# Patient Record
Sex: Male | Born: 1980 | Race: White | Hispanic: No | Marital: Married | Smoking: Former smoker
Health system: Southern US, Community
[De-identification: ages and names within clinical notes are randomized; demographics above are authoritative.]

## PROBLEM LIST (undated history)

## (undated) DIAGNOSIS — F419 Anxiety disorder, unspecified: Secondary | ICD-10-CM

## (undated) DIAGNOSIS — K760 Fatty (change of) liver, not elsewhere classified: Secondary | ICD-10-CM

## (undated) DIAGNOSIS — I10 Essential (primary) hypertension: Secondary | ICD-10-CM

## (undated) DIAGNOSIS — G4730 Sleep apnea, unspecified: Secondary | ICD-10-CM

## (undated) HISTORY — PX: WISDOM TOOTH EXTRACTION: SHX21

---
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
## 2001-04-28 ENCOUNTER — Encounter: Admission: RE | Admit: 2001-04-28 | Discharge: 2001-07-27 | Payer: Self-pay | Admitting: *Deleted

## 2001-07-07 ENCOUNTER — Emergency Department (HOSPITAL_COMMUNITY): Admission: EM | Admit: 2001-07-07 | Discharge: 2001-07-08 | Payer: Self-pay | Admitting: Emergency Medicine

## 2007-11-01 ENCOUNTER — Ambulatory Visit (HOSPITAL_COMMUNITY): Admission: RE | Admit: 2007-11-01 | Discharge: 2007-11-01 | Payer: Self-pay | Admitting: Sports Medicine

## 2010-10-19 ENCOUNTER — Encounter: Payer: Self-pay | Admitting: Sports Medicine

## 2017-05-25 ENCOUNTER — Other Ambulatory Visit: Payer: Self-pay | Admitting: Neurosurgery

## 2017-06-07 ENCOUNTER — Encounter (HOSPITAL_COMMUNITY): Payer: Self-pay

## 2017-06-07 ENCOUNTER — Other Ambulatory Visit (HOSPITAL_COMMUNITY): Payer: Self-pay | Admitting: *Deleted

## 2017-06-07 NOTE — Pre-Procedure Instructions (Signed)
Rosalie GumsRyan J Dekay  06/07/2017      [facility] [id number] - [place], [place] - [address] [place] [id number] Phone: [phone] Fax: [phone]    Your procedure is scheduled on 06-10-2017  Wednesday .  [facility] Admitting at  9:30 A.M.   Call this number if you have problems the morning of surgery:  [phone]   Remember:  Do not eat food or drink liquids after midnight.   Take these medicines the morning of surgery with A SIP OF WATER Alprazolam(Xanax),pain medication if needed,  STOP ASPIRIN,ANTIINFLAMATORIES (IBUPROFEN,ALEVE,MOTRIN,ADVIL,GOODY'S POWDERS),HERBAL SUPPLEMENTS,FISH OIL,AND VITAMINS 5-7 DAYS PRIOR TO SURGERY   Do not wear jewelry,   Do not wear lotions, powders, or perfumes, or deoderant.  Do not shave 48 hours prior to surgery.  Men may shave face and neck.   Do not bring valuables to the hospital.  [facility] is not responsible for any belongings or valuables.  Contacts, dentures or bridgework may not be worn into surgery.  Leave your suitcase in the car.  After surgery it may be brought to your room.  For patients admitted to the hospital, discharge time will be determined by your treatment team.  Patients discharged the day of surgery will not be allowed to drive home.    Special Instructions:  - Preparing for Surgery  Before surgery, you can play an important role.  Because skin is not sterile, your skin needs to be as free of germs as possible.  You can reduce the number of germs on you skin by washing with CHG (chlorahexidine gluconate) soap before surgery.  CHG is an antiseptic cleaner which kills germs and bonds with the skin to continue killing germs even after washing.  Please DO NOT use if you have an allergy to CHG or antibacterial soaps.  If your skin becomes reddened/irritated stop using the CHG and inform your nurse when you arrive at  Short Stay.  Do not shave (including legs and underarms) for at least 48 hours prior to the first CHG shower.  You may shave your face.  Please follow these instructions carefully:   1.  Shower with CHG Soap the night before surgery and the   morning of Surgery.  2.  If you choose to wash your hair, wash your hair first as usual with your normal shampoo.  3.  After you shampoo, rinse your hair and body thoroughly to remove the  Shampoo.  4.  Use CHG as you would any other liquid soap.  You can apply chg directly  to the skin and wash gently with scrungie or a clean washcloth.  5.  Apply the CHG Soap to your body ONLY FROM THE NECK DOWN.   Do not use on open wounds or open sores.  Avoid contact with your eyes,  ears, mouth and genitals (private parts).  Wash genitals (private parts) with your normal soap.  6.  Wash thoroughly, paying special attention to the area where your surgery will be performed.  7.  Thoroughly rinse your body with warm water from the neck down.  8.  DO NOT shower/wash with your normal soap after using and rinsing o  the CHG Soap.  9.  Pat yourself dry with a clean towel.            10.  Wear clean pajamas.  11.  Place clean sheets on your bed the night of your first shower and do not sleep with pets.  Day of Surgery  Do not apply any lotions/deodorants the morning of surgery.  Please wear clean clothes to the hospital/surgery center.   Please read over the following fact sheets that you were given. MRSA Information and Surgical Site Infection Prevention

## 2017-06-08 ENCOUNTER — Encounter (HOSPITAL_COMMUNITY)
Admission: RE | Admit: 2017-06-08 | Discharge: 2017-06-08 | Disposition: A | Discharge status: Home / Self Care | Payer: Managed Care, Other (non HMO) | Source: Ambulatory Visit | Attending: Neurosurgery | Admitting: Neurosurgery

## 2017-06-08 ENCOUNTER — Encounter (HOSPITAL_COMMUNITY): Payer: Self-pay

## 2017-06-08 HISTORY — DX: Fatty (change of) liver, not elsewhere classified: K76.0

## 2017-06-08 HISTORY — DX: Anxiety disorder, unspecified: F41.9

## 2017-06-08 HISTORY — DX: Essential (primary) hypertension: I10

## 2017-06-08 HISTORY — DX: Sleep apnea, unspecified: G47.30

## 2017-06-08 LAB — CBC
HCT: 46.5 % (ref 39.0–52.0)
Hemoglobin: 15.8 g/dL (ref 13.0–17.0)
MCH: 31.8 pg (ref 26.0–34.0)
MCHC: 34 g/dL (ref 30.0–36.0)
MCV: 93.6 fL (ref 78.0–100.0)
Platelets: 157 10*3/uL (ref 150–400)
RBC: 4.97 MIL/uL (ref 4.22–5.81)
RDW: 12.9 % (ref 11.5–15.5)
WBC: 9.4 10*3/uL (ref 4.0–10.5)

## 2017-06-08 LAB — TYPE AND SCREEN
ABO/RH(D): O POS
Antibody Screen: NEGATIVE

## 2017-06-08 LAB — COMPREHENSIVE METABOLIC PANEL
ALT: 67 U/L — ABNORMAL HIGH (ref 17–63)
AST: 48 U/L — ABNORMAL HIGH (ref 15–41)
Albumin: 4.2 g/dL (ref 3.5–5.0)
Alkaline Phosphatase: 82 U/L (ref 38–126)
Anion gap: 9 (ref 5–15)
BUN: 12 mg/dL (ref 6–20)
CO2: 24 mmol/L (ref 22–32)
Calcium: 9.6 mg/dL (ref 8.9–10.3)
Chloride: 103 mmol/L (ref 101–111)
Creatinine, Ser: 1.04 mg/dL (ref 0.61–1.24)
GFR calc Af Amer: >60 mL/min (ref >60)
GFR calc non Af Amer: >60 mL/min (ref >60)
Glucose, Bld: 86 mg/dL (ref 65–99)
Potassium: 3.6 mmol/L (ref 3.5–5.1)
Sodium: 136 mmol/L (ref 135–145)
Total Bilirubin: 0.8 mg/dL (ref 0.3–1.2)
Total Protein: 7.2 g/dL (ref 6.5–8.1)

## 2017-06-08 LAB — SURGICAL PCR SCREEN
MRSA, PCR: NEGATIVE
Staphylococcus aureus: NEGATIVE

## 2017-06-08 LAB — ABO/RH: ABO/RH(D): O POS

## 2017-06-08 NOTE — Progress Notes (Signed)
Never seen by cardiologist  Denies any cardiac testing,Stress test, ECHO  No complaints of chest pain or discomfort

## 2017-06-10 ENCOUNTER — Inpatient Hospital Stay (HOSPITAL_COMMUNITY): Payer: Managed Care, Other (non HMO) | Admitting: Anesthesiology

## 2017-06-10 ENCOUNTER — Encounter (HOSPITAL_COMMUNITY)
Admission: RE | Disposition: A | Discharge status: Home / Self Care | Payer: Self-pay | Source: Ambulatory Visit | Attending: Neurosurgery

## 2017-06-10 ENCOUNTER — Inpatient Hospital Stay (HOSPITAL_COMMUNITY)
Admission: RE | Admit: 2017-06-10 | Discharge: 2017-06-11 | DRG: 455 | Disposition: A | Discharge status: Home / Self Care | Payer: Managed Care, Other (non HMO) | Source: Ambulatory Visit | Attending: Neurosurgery | Admitting: Neurosurgery

## 2017-06-10 ENCOUNTER — Encounter (HOSPITAL_COMMUNITY): Payer: Self-pay | Admitting: General Practice

## 2017-06-10 ENCOUNTER — Inpatient Hospital Stay (HOSPITAL_COMMUNITY): Payer: Managed Care, Other (non HMO)

## 2017-06-10 DIAGNOSIS — M48061 Spinal stenosis, lumbar region without neurogenic claudication: Secondary | ICD-10-CM | POA: Diagnosis present

## 2017-06-10 DIAGNOSIS — Z87891 Personal history of nicotine dependence: Secondary | ICD-10-CM

## 2017-06-10 DIAGNOSIS — G4730 Sleep apnea, unspecified: Secondary | ICD-10-CM | POA: Diagnosis present

## 2017-06-10 DIAGNOSIS — Z6837 Body mass index (BMI) 37.0-37.9, adult: Secondary | ICD-10-CM | POA: Diagnosis not present

## 2017-06-10 DIAGNOSIS — Z9989 Dependence on other enabling machines and devices: Secondary | ICD-10-CM

## 2017-06-10 DIAGNOSIS — K219 Gastro-esophageal reflux disease without esophagitis: Secondary | ICD-10-CM | POA: Diagnosis present

## 2017-06-10 DIAGNOSIS — M4316 Spondylolisthesis, lumbar region: Secondary | ICD-10-CM | POA: Diagnosis present

## 2017-06-10 DIAGNOSIS — I10 Essential (primary) hypertension: Secondary | ICD-10-CM | POA: Diagnosis present

## 2017-06-10 DIAGNOSIS — M5416 Radiculopathy, lumbar region: Secondary | ICD-10-CM | POA: Diagnosis present

## 2017-06-10 DIAGNOSIS — Z419 Encounter for procedure for purposes other than remedying health state, unspecified: Secondary | ICD-10-CM

## 2017-06-10 SURGERY — POSTERIOR LUMBAR FUSION 1 LEVEL
Anesthesia: General | Site: Back

## 2017-06-10 MED ORDER — VANCOMYCIN HCL 1000 MG IV SOLR
INTRAVENOUS | Status: DC | PRN
  Administered 2017-06-10: 1000 mg via TOPICAL

## 2017-06-10 MED ORDER — ZOLPIDEM TARTRATE 5 MG PO TABS: 10.0000 mg | ORAL_TABLET | Freq: Every evening | ORAL | Status: DC | PRN

## 2017-06-10 MED ORDER — PHENYLEPHRINE 40 MCG/ML (10ML) SYRINGE FOR IV PUSH (FOR BLOOD PRESSURE SUPPORT)
PREFILLED_SYRINGE | INTRAVENOUS | Status: AC
  Filled 2017-06-10: qty 10

## 2017-06-10 MED ORDER — ONDANSETRON HCL 4 MG/2ML IJ SOLN: 4.0000 mg | Freq: Four times a day (QID) | INTRAMUSCULAR | Status: DC | PRN

## 2017-06-10 MED ORDER — ROCURONIUM BROMIDE 100 MG/10ML IV SOLN
INTRAVENOUS | Status: DC | PRN
Start: 2017-06-10 — End: 2017-06-10
  Administered 2017-06-10: 20 mg via INTRAVENOUS
  Administered 2017-06-10: 10 mg via INTRAVENOUS
  Administered 2017-06-10: 60 mg via INTRAVENOUS
  Administered 2017-06-10 (×2): 20 mg via INTRAVENOUS
  Administered 2017-06-10: 10 mg via INTRAVENOUS

## 2017-06-10 MED ORDER — ONDANSETRON HCL 4 MG/2ML IJ SOLN
INTRAMUSCULAR | Status: AC
  Filled 2017-06-10: qty 2

## 2017-06-10 MED ORDER — SUGAMMADEX SODIUM 200 MG/2ML IV SOLN
INTRAVENOUS | Status: AC
  Filled 2017-06-10: qty 2

## 2017-06-10 MED ORDER — HYDROMORPHONE HCL 1 MG/ML IJ SOLN
0.2500 mg | INTRAMUSCULAR | Status: DC | PRN
  Administered 2017-06-10 (×2): 0.5 mg via INTRAVENOUS

## 2017-06-10 MED ORDER — HYDROMORPHONE HCL 1 MG/ML IJ SOLN
0.5000 mg | INTRAMUSCULAR | Status: DC | PRN
  Administered 2017-06-10: 0.5 mg via INTRAVENOUS
  Filled 2017-06-10: qty 0.5

## 2017-06-10 MED ORDER — VANCOMYCIN HCL 1000 MG IV SOLR
INTRAVENOUS | Status: AC
  Filled 2017-06-10: qty 1000

## 2017-06-10 MED ORDER — THROMBIN 20000 UNITS EX SOLR
CUTANEOUS | Status: AC
  Filled 2017-06-10: qty 20000

## 2017-06-10 MED ORDER — MIDAZOLAM HCL 2 MG/2ML IJ SOLN
INTRAMUSCULAR | Status: AC
  Filled 2017-06-10: qty 2

## 2017-06-10 MED ORDER — SUGAMMADEX SODIUM 200 MG/2ML IV SOLN
INTRAVENOUS | Status: DC | PRN
  Administered 2017-06-10: 200 mg via INTRAVENOUS

## 2017-06-10 MED ORDER — LIDOCAINE HCL (CARDIAC) 20 MG/ML IV SOLN
INTRAVENOUS | Status: DC | PRN
  Administered 2017-06-10: 20 mg via INTRAVENOUS

## 2017-06-10 MED ORDER — BUPIVACAINE HCL (PF) 0.5 % IJ SOLN
INTRAMUSCULAR | Status: AC
  Filled 2017-06-10: qty 30

## 2017-06-10 MED ORDER — FENTANYL CITRATE (PF) 250 MCG/5ML IJ SOLN
INTRAMUSCULAR | Status: AC
  Filled 2017-06-10: qty 5

## 2017-06-10 MED ORDER — MIDAZOLAM HCL 5 MG/5ML IJ SOLN
INTRAMUSCULAR | Status: DC | PRN
  Administered 2017-06-10: 2 mg via INTRAVENOUS

## 2017-06-10 MED ORDER — OXYCODONE-ACETAMINOPHEN 5-325 MG PO TABS
1.0000 | ORAL_TABLET | ORAL | Status: DC | PRN
  Administered 2017-06-10 – 2017-06-11 (×4): 2 via ORAL
  Filled 2017-06-10 (×4): qty 2

## 2017-06-10 MED ORDER — THROMBIN 20000 UNITS EX SOLR
CUTANEOUS | Status: DC | PRN
  Administered 2017-06-10: 13:00:00 via TOPICAL

## 2017-06-10 MED ORDER — CHLORHEXIDINE GLUCONATE CLOTH 2 % EX PADS: 6.0000 | MEDICATED_PAD | Freq: Once | CUTANEOUS | Status: DC

## 2017-06-10 MED ORDER — ONDANSETRON HCL 4 MG PO TABS: 4.0000 mg | ORAL_TABLET | Freq: Four times a day (QID) | ORAL | Status: DC | PRN

## 2017-06-10 MED ORDER — DEXAMETHASONE SODIUM PHOSPHATE 10 MG/ML IJ SOLN
INTRAMUSCULAR | Status: AC
  Filled 2017-06-10: qty 1

## 2017-06-10 MED ORDER — LIDOCAINE-EPINEPHRINE 1 %-1:100000 IJ SOLN
INTRAMUSCULAR | Status: DC | PRN
  Administered 2017-06-10: 10 mL

## 2017-06-10 MED ORDER — ONDANSETRON HCL 4 MG/2ML IJ SOLN
INTRAMUSCULAR | Status: DC | PRN
  Administered 2017-06-10: 4 mg via INTRAVENOUS

## 2017-06-10 MED ORDER — PANTOPRAZOLE SODIUM 40 MG PO TBEC
40.0000 mg | DELAYED_RELEASE_TABLET | Freq: Every day | ORAL | Status: DC
  Administered 2017-06-10: 40 mg via ORAL
  Filled 2017-06-10: qty 1

## 2017-06-10 MED ORDER — CEFAZOLIN SODIUM-DEXTROSE 2-4 GM/100ML-% IV SOLN
2.0000 g | Freq: Three times a day (TID) | INTRAVENOUS | Status: DC
  Administered 2017-06-10 – 2017-06-11 (×2): 2 g via INTRAVENOUS
  Filled 2017-06-10 (×2): qty 100

## 2017-06-10 MED ORDER — MEPERIDINE HCL 25 MG/ML IJ SOLN: 6.2500 mg | INTRAMUSCULAR | Status: DC | PRN

## 2017-06-10 MED ORDER — ROCURONIUM BROMIDE 10 MG/ML (PF) SYRINGE
PREFILLED_SYRINGE | INTRAVENOUS | Status: AC
  Filled 2017-06-10: qty 5

## 2017-06-10 MED ORDER — FENTANYL CITRATE (PF) 100 MCG/2ML IJ SOLN
INTRAMUSCULAR | Status: DC | PRN
  Administered 2017-06-10 (×2): 50 ug via INTRAVENOUS
  Administered 2017-06-10: 200 ug via INTRAVENOUS
  Administered 2017-06-10: 50 ug via INTRAVENOUS
  Administered 2017-06-10: 100 ug via INTRAVENOUS
  Administered 2017-06-10: 50 ug via INTRAVENOUS

## 2017-06-10 MED ORDER — PHENOL 1.4 % MT LIQD: 1.0000 | OROMUCOSAL | Status: DC | PRN

## 2017-06-10 MED ORDER — AMLODIPINE BESYLATE 10 MG PO TABS
10.0000 mg | ORAL_TABLET | Freq: Every day | ORAL | Status: DC
  Administered 2017-06-11: 10 mg via ORAL
  Filled 2017-06-10 (×2): qty 1

## 2017-06-10 MED ORDER — CALCIUM CARBONATE ANTACID 500 MG PO CHEW
2.0000 | CHEWABLE_TABLET | Freq: Three times a day (TID) | ORAL | Status: DC | PRN
  Filled 2017-06-10: qty 2

## 2017-06-10 MED ORDER — OXYCODONE-ACETAMINOPHEN 10-325 MG PO TABS: 1.0000 | ORAL_TABLET | Freq: Four times a day (QID) | ORAL | Status: DC | PRN

## 2017-06-10 MED ORDER — DEXAMETHASONE SODIUM PHOSPHATE 10 MG/ML IJ SOLN
10.0000 mg | INTRAMUSCULAR | Status: AC
  Administered 2017-06-10: 10 mg via INTRAVENOUS

## 2017-06-10 MED ORDER — OXYCODONE HCL 5 MG PO TABS
5.0000 mg | ORAL_TABLET | ORAL | Status: DC | PRN
  Administered 2017-06-10: 10 mg via ORAL

## 2017-06-10 MED ORDER — HYDROCHLOROTHIAZIDE 25 MG PO TABS
25.0000 mg | ORAL_TABLET | Freq: Every day | ORAL | Status: DC
  Administered 2017-06-11: 25 mg via ORAL
  Filled 2017-06-10: qty 1

## 2017-06-10 MED ORDER — MENTHOL 3 MG MT LOZG: 1.0000 | LOZENGE | OROMUCOSAL | Status: DC | PRN

## 2017-06-10 MED ORDER — ACETAMINOPHEN 325 MG PO TABS: 650.0000 mg | ORAL_TABLET | ORAL | Status: DC | PRN

## 2017-06-10 MED ORDER — BUPIVACAINE LIPOSOME 1.3 % IJ SUSP
20.0000 mL | INTRAMUSCULAR | Status: DC
  Filled 2017-06-10: qty 20

## 2017-06-10 MED ORDER — LIDOCAINE-EPINEPHRINE 1 %-1:100000 IJ SOLN
INTRAMUSCULAR | Status: AC
  Filled 2017-06-10: qty 1

## 2017-06-10 MED ORDER — MIDAZOLAM HCL 2 MG/2ML IJ SOLN: 0.5000 mg | Freq: Once | INTRAMUSCULAR | Status: DC | PRN

## 2017-06-10 MED ORDER — LIDOCAINE 2% (20 MG/ML) 5 ML SYRINGE
INTRAMUSCULAR | Status: AC
  Filled 2017-06-10: qty 10

## 2017-06-10 MED ORDER — CYCLOBENZAPRINE HCL 10 MG PO TABS
10.0000 mg | ORAL_TABLET | Freq: Three times a day (TID) | ORAL | Status: DC | PRN
  Administered 2017-06-10 – 2017-06-11 (×3): 10 mg via ORAL
  Filled 2017-06-10 (×2): qty 1

## 2017-06-10 MED ORDER — OXYCODONE HCL 5 MG PO TABS
ORAL_TABLET | ORAL | Status: AC
Start: 2017-06-10 — End: 2017-06-11
  Filled 2017-06-10: qty 2

## 2017-06-10 MED ORDER — PHENYLEPHRINE HCL 10 MG/ML IJ SOLN
INTRAMUSCULAR | Status: DC | PRN
  Administered 2017-06-10: 20 ug/min via INTRAVENOUS

## 2017-06-10 MED ORDER — EPHEDRINE 5 MG/ML INJ
INTRAVENOUS | Status: AC
  Filled 2017-06-10: qty 10

## 2017-06-10 MED ORDER — SODIUM CHLORIDE 0.9% FLUSH: 3.0000 mL | INTRAVENOUS | Status: DC | PRN

## 2017-06-10 MED ORDER — PROPOFOL 10 MG/ML IV BOLUS
INTRAVENOUS | Status: AC
  Filled 2017-06-10: qty 20

## 2017-06-10 MED ORDER — PHENYLEPHRINE HCL 10 MG/ML IJ SOLN
INTRAMUSCULAR | Status: DC | PRN
  Administered 2017-06-10: 80 ug via INTRAVENOUS
  Administered 2017-06-10: 40 ug via INTRAVENOUS

## 2017-06-10 MED ORDER — PANTOPRAZOLE SODIUM 40 MG IV SOLR: 40.0000 mg | Freq: Every day | INTRAVENOUS | Status: DC

## 2017-06-10 MED ORDER — PROMETHAZINE HCL 25 MG/ML IJ SOLN: 6.2500 mg | INTRAMUSCULAR | Status: DC | PRN

## 2017-06-10 MED ORDER — CEFAZOLIN SODIUM-DEXTROSE 2-4 GM/100ML-% IV SOLN
2.0000 g | INTRAVENOUS | Status: AC
  Administered 2017-06-10: 2 g via INTRAVENOUS

## 2017-06-10 MED ORDER — CYCLOBENZAPRINE HCL 10 MG PO TABS
ORAL_TABLET | ORAL | Status: AC
  Filled 2017-06-10: qty 1

## 2017-06-10 MED ORDER — THROMBIN 5000 UNITS EX SOLR
CUTANEOUS | Status: AC
  Filled 2017-06-10: qty 5000

## 2017-06-10 MED ORDER — ALPRAZOLAM 0.25 MG PO TABS: 0.2500 mg | ORAL_TABLET | Freq: Two times a day (BID) | ORAL | Status: DC | PRN

## 2017-06-10 MED ORDER — ALUM & MAG HYDROXIDE-SIMETH 200-200-20 MG/5ML PO SUSP: 30.0000 mL | Freq: Four times a day (QID) | ORAL | Status: DC | PRN

## 2017-06-10 MED ORDER — BUPIVACAINE LIPOSOME 1.3 % IJ SUSP
INTRAMUSCULAR | Status: DC | PRN
Start: 2017-06-10 — End: 2017-06-10
  Administered 2017-06-10: 20 mL

## 2017-06-10 MED ORDER — ACETAMINOPHEN 650 MG RE SUPP: 650.0000 mg | RECTAL | Status: DC | PRN

## 2017-06-10 MED ORDER — 0.9 % SODIUM CHLORIDE (POUR BTL) OPTIME
TOPICAL | Status: DC | PRN
  Administered 2017-06-10: 1000 mL

## 2017-06-10 MED ORDER — SODIUM CHLORIDE 0.9% FLUSH
3.0000 mL | Freq: Two times a day (BID) | INTRAVENOUS | Status: DC
  Administered 2017-06-10: 3 mL via INTRAVENOUS

## 2017-06-10 MED ORDER — LACTATED RINGERS IV SOLN
INTRAVENOUS | Status: DC
  Administered 2017-06-10 (×4): via INTRAVENOUS

## 2017-06-10 MED ORDER — BACITRACIN 50000 UNITS IM SOLR
INTRAMUSCULAR | Status: DC | PRN
  Administered 2017-06-10: 13:00:00

## 2017-06-10 MED ORDER — BISACODYL 5 MG PO TBEC: 5.0000 mg | DELAYED_RELEASE_TABLET | Freq: Every day | ORAL | Status: DC | PRN

## 2017-06-10 MED ORDER — PROPOFOL 10 MG/ML IV BOLUS
INTRAVENOUS | Status: DC | PRN
  Administered 2017-06-10: 100 mg via INTRAVENOUS

## 2017-06-10 MED ORDER — HYDROMORPHONE HCL 1 MG/ML IJ SOLN
INTRAMUSCULAR | Status: AC
  Administered 2017-06-10: 0.5 mg via INTRAVENOUS
  Filled 2017-06-10: qty 1

## 2017-06-10 MED ORDER — CEFAZOLIN SODIUM-DEXTROSE 2-4 GM/100ML-% IV SOLN
INTRAVENOUS | Status: AC
  Filled 2017-06-10: qty 100

## 2017-06-10 MED ORDER — SERTRALINE HCL 50 MG PO TABS
100.0000 mg | ORAL_TABLET | Freq: Every day | ORAL | Status: DC
  Administered 2017-06-10: 100 mg via ORAL
  Filled 2017-06-10: qty 2

## 2017-06-10 SURGICAL SUPPLY — 80 items
ADH SKN CLS APL DERMABOND .7 (GAUZE/BANDAGES/DRESSINGS) ×1
APL SKNCLS STERI-STRIP NONHPOA (GAUZE/BANDAGES/DRESSINGS) ×1
BAG DECANTER FOR FLEXI CONT (MISCELLANEOUS) ×2 IMPLANT
BENZOIN TINCTURE PRP APPL 2/3 (GAUZE/BANDAGES/DRESSINGS) ×2 IMPLANT
BLADE CLIPPER SURG (BLADE) IMPLANT
BLADE SURG 11 STRL SS (BLADE) ×2 IMPLANT
BONE VIVIGEN FORMABLE 5.4CC (Bone Implant) ×2 IMPLANT
BUR CUTTER 7.0 ROUND (BURR) ×2 IMPLANT
BUR MATCHSTICK NEURO 3.0 LAGG (BURR) ×2 IMPLANT
CAGE RISE 11-17-15 10X26 (Cage) ×2 IMPLANT
CANISTER SUCT 3000ML PPV (MISCELLANEOUS) ×2 IMPLANT
CAP LOCKING THREADED (Cap) ×4 IMPLANT
CARTRIDGE OIL MAESTRO DRILL (MISCELLANEOUS) ×1 IMPLANT
CONT SPEC 4OZ CLIKSEAL STRL BL (MISCELLANEOUS) ×2 IMPLANT
COVER BACK TABLE 60X90IN (DRAPES) ×2 IMPLANT
CROSSLINK SPINAL FUSION (Cage) ×1 IMPLANT
DECANTER SPIKE VIAL GLASS SM (MISCELLANEOUS) ×2 IMPLANT
DERMABOND ADVANCED (GAUZE/BANDAGES/DRESSINGS) ×1
DERMABOND ADVANCED .7 DNX12 (GAUZE/BANDAGES/DRESSINGS) ×1 IMPLANT
DIFFUSER DRILL AIR PNEUMATIC (MISCELLANEOUS) ×2 IMPLANT
DRAPE C-ARM 42X72 X-RAY (DRAPES) ×2 IMPLANT
DRAPE C-ARMOR (DRAPES) ×2 IMPLANT
DRAPE HALF SHEET 40X57 (DRAPES) IMPLANT
DRAPE LAPAROTOMY 100X72X124 (DRAPES) ×2 IMPLANT
DRAPE POUCH INSTRU U-SHP 10X18 (DRAPES) ×2 IMPLANT
DRAPE SURG 17X23 STRL (DRAPES) ×2 IMPLANT
DRSG OPSITE 4X5.5 SM (GAUZE/BANDAGES/DRESSINGS) ×1 IMPLANT
DRSG OPSITE POSTOP 4X6 (GAUZE/BANDAGES/DRESSINGS) ×1 IMPLANT
DURAPREP 26ML APPLICATOR (WOUND CARE) ×2 IMPLANT
ELECT BLADE 4.0 EZ CLEAN MEGAD (MISCELLANEOUS) ×2
ELECT REM PT RETURN 9FT ADLT (ELECTROSURGICAL) ×2
ELECTRODE BLDE 4.0 EZ CLN MEGD (MISCELLANEOUS) IMPLANT
ELECTRODE REM PT RTRN 9FT ADLT (ELECTROSURGICAL) ×1 IMPLANT
EVACUATOR 3/16  PVC DRAIN (DRAIN) ×1
EVACUATOR 3/16 PVC DRAIN (DRAIN) IMPLANT
GAUZE SPONGE 4X4 12PLY STRL (GAUZE/BANDAGES/DRESSINGS) ×2 IMPLANT
GAUZE SPONGE 4X4 16PLY XRAY LF (GAUZE/BANDAGES/DRESSINGS) ×1 IMPLANT
GLOVE BIO SURGEON STRL SZ7 (GLOVE) IMPLANT
GLOVE BIO SURGEON STRL SZ8 (GLOVE) ×4 IMPLANT
GLOVE BIOGEL PI IND STRL 7.0 (GLOVE) IMPLANT
GLOVE BIOGEL PI INDICATOR 7.0 (GLOVE)
GLOVE ECLIPSE 7.5 STRL STRAW (GLOVE) IMPLANT
GLOVE EXAM NITRILE LRG STRL (GLOVE) IMPLANT
GLOVE EXAM NITRILE XL STR (GLOVE) IMPLANT
GLOVE EXAM NITRILE XS STR PU (GLOVE) IMPLANT
GLOVE INDICATOR 8.5 STRL (GLOVE) ×4 IMPLANT
GOWN STRL REUS W/ TWL LRG LVL3 (GOWN DISPOSABLE) IMPLANT
GOWN STRL REUS W/ TWL XL LVL3 (GOWN DISPOSABLE) ×2 IMPLANT
GOWN STRL REUS W/TWL 2XL LVL3 (GOWN DISPOSABLE) IMPLANT
GOWN STRL REUS W/TWL LRG LVL3 (GOWN DISPOSABLE)
GOWN STRL REUS W/TWL XL LVL3 (GOWN DISPOSABLE) ×4
GRAFT BNE MATRIX VG FRMBL MD 5 (Bone Implant) IMPLANT
HEMOSTAT POWDER KIT SURGIFOAM (HEMOSTASIS) IMPLANT
KIT BASIN OR (CUSTOM PROCEDURE TRAY) ×2 IMPLANT
KIT ROOM TURNOVER OR (KITS) ×2 IMPLANT
MILL MEDIUM DISP (BLADE) ×1 IMPLANT
NDL HYPO 21X1.5 SAFETY (NEEDLE) IMPLANT
NDL HYPO 25X1 1.5 SAFETY (NEEDLE) ×1 IMPLANT
NEEDLE HYPO 21X1.5 SAFETY (NEEDLE) ×2 IMPLANT
NEEDLE HYPO 25X1 1.5 SAFETY (NEEDLE) ×2 IMPLANT
NS IRRIG 1000ML POUR BTL (IV SOLUTION) ×3 IMPLANT
OIL CARTRIDGE MAESTRO DRILL (MISCELLANEOUS) ×2
PACK LAMINECTOMY NEURO (CUSTOM PROCEDURE TRAY) ×2 IMPLANT
PAD ARMBOARD 7.5X6 YLW CONV (MISCELLANEOUS) ×6 IMPLANT
PATTIES SURGICAL 1X1 (DISPOSABLE) ×1 IMPLANT
ROD CREO 45MM SPINAL (Rod) ×2 IMPLANT
SCREW CREO 6.5X40 (Screw) ×4 IMPLANT
SCREW PA THRD CREO TULIP 5.5X4 (Head) ×4 IMPLANT
SPONGE LAP 4X18 X RAY DECT (DISPOSABLE) IMPLANT
SPONGE SURGIFOAM ABS GEL 100 (HEMOSTASIS) ×2 IMPLANT
STRIP CLOSURE SKIN 1/2X4 (GAUZE/BANDAGES/DRESSINGS) ×3 IMPLANT
SUT VIC AB 0 CT1 18XCR BRD8 (SUTURE) ×2 IMPLANT
SUT VIC AB 0 CT1 8-18 (SUTURE) ×2
SUT VIC AB 2-0 CT1 18 (SUTURE) ×3 IMPLANT
SUT VIC AB 4-0 PS2 27 (SUTURE) ×2 IMPLANT
SYRINGE 20CC LL (MISCELLANEOUS) ×1 IMPLANT
TOWEL GREEN STERILE (TOWEL DISPOSABLE) ×2 IMPLANT
TOWEL GREEN STERILE FF (TOWEL DISPOSABLE) ×2 IMPLANT
TRAY FOLEY W/METER SILVER 16FR (SET/KITS/TRAYS/PACK) ×2 IMPLANT
WATER STERILE IRR 1000ML POUR (IV SOLUTION) ×2 IMPLANT

## 2017-06-10 NOTE — H&P (Signed)
Albert Skinner is an 36 y.o. male.   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 Pain is Progressively worse back and right greater left leg. Workup has been consistent with severe stenosis listhesis at L4-5 due to his failure conservative treatment imaging findings and progressive conical syndrome or recommended decompression sterilization procedure at L4-5. I extensively went over the risks and benefits of the operation with him as well as perioperative course expectations of outcome and alternatives of surgery and he understood and agreed to proceed forward.   Past Medical History:  Diagnosis Date  . Anxiety   . Fatty liver   . Hypertension   . Sleep apnea    uses CPAP    Past Surgical History:  Procedure Laterality Date  . WISDOM TOOTH EXTRACTION      History reviewed. No pertinent family history. Social History:  reports that he quit smoking about a year ago. His smoking use included Cigarettes. He has a 15.00 pack-year smoking history. He quit smokeless tobacco use about 18 years ago. He reports that he drinks alcohol. He reports that he uses drugs, including Marijuana.  Allergies: No Known Allergies  Medications Prior to Admission  Medication Sig Dispense Refill  . ALPRAZolam (XANAX) 0.25 MG tablet Take 0.25 mg by mouth 2 (two) times daily as needed for anxiety.    Marland Kitchen amLODipine (NORVASC) 10 MG tablet Take 10 mg by mouth daily.    . calcium carbonate (TUMS - DOSED IN MG ELEMENTAL CALCIUM) 500 MG chewable tablet Chew 2 tablets by mouth 3 (three) times daily as needed for indigestion or heartburn.    . hydrochlorothiazide (HYDRODIURIL) 25 MG tablet Take 25 mg by mouth daily.    Marland Kitchen oxyCODONE-acetaminophen (PERCOCET) 10-325 MG tablet Take 1 tablet by mouth every 6 (six) hours as needed for pain.    Marland Kitchen sertraline (ZOLOFT) 100 MG tablet Take 100 mg  by mouth at bedtime.    Marland Kitchen zolpidem (AMBIEN) 10 MG tablet Take 10 mg by mouth at bedtime as needed for sleep.      Results for orders placed or performed during the hospital encounter of 06/08/17 (from the past 48 hour(s))  Surgical pcr screen     Status: None   Collection Time: 06/08/17  1:49 PM  Result Value Ref Range   MRSA, PCR NEGATIVE NEGATIVE   Staphylococcus aureus NEGATIVE NEGATIVE    Comment: (NOTE) The Xpert SA Assay (FDA approved for NASAL specimens in patients 104 years of age and older), is one component of a comprehensive surveillance program. It is not intended to diagnose infection nor to guide or monitor treatment.   CBC     Status: None   Collection Time: 06/08/17  1:50 PM  Result Value Ref Range   WBC 9.4 4.0 - 10.5 K/uL   RBC 4.97 4.22 - 5.81 MIL/uL   Hemoglobin 15.8 13.0 - 17.0 g/dL   HCT 46.5 39.0 - 52.0 %   MCV 93.6 78.0 - 100.0 fL   MCH 31.8 26.0 - 34.0 pg   MCHC 34.0 30.0 - 36.0 g/dL   RDW 12.9 11.5 - 15.5 %   Platelets 157 150 - 400 K/uL  Comprehensive metabolic panel     Status: Abnormal   Collection Time: 06/08/17  1:50 PM  Result Value Ref Range   Sodium 136 135 - 145 mmol/L   Potassium 3.6 3.5 -  5.1 mmol/L   Chloride 103 101 - 111 mmol/L   CO2 24 22 - 32 mmol/L   Glucose, Bld 86 65 - 99 mg/dL   BUN 12 6 - 20 mg/dL   Creatinine, Ser 1.04 0.61 - 1.24 mg/dL   Calcium 9.6 8.9 - 10.3 mg/dL   Total Protein 7.2 6.5 - 8.1 g/dL   Albumin 4.2 3.5 - 5.0 g/dL   AST 48 (H) 15 - 41 U/L   ALT 67 (H) 17 - 63 U/L   Alkaline Phosphatase 82 38 - 126 U/L   Total Bilirubin 0.8 0.3 - 1.2 mg/dL   GFR calc non Af Amer >60 >60 mL/min   GFR calc Af Amer >60 >60 mL/min    Comment: (NOTE) The eGFR has been calculated using the CKD EPI equation. This calculation has not been validated in all clinical situations. eGFR's persistently <60 mL/min signify possible Chronic Kidney Disease.    Anion gap 9 5 - 15  Type and screen All Cardiac and thoracic surgeries, spinal  fusions, myomectomies, craniotomies, colon & liver resections, total joint revisions, same day c-section with placenta previa or accreta.     Status: None   Collection Time: 06/08/17  2:05 PM  Result Value Ref Range   ABO/RH(D) O POS    Antibody Screen NEG    Sample Expiration 06/22/2017    Extend sample reason NO TRANSFUSIONS OR PREGNANCY IN THE PAST 3 MONTHS   ABO/Rh     Status: None   Collection Time: 06/08/17  2:05 PM  Result Value Ref Range   ABO/RH(D) O POS    No results found.  Review of Systems  Musculoskeletal: Positive for back pain and myalgias.  Neurological: Positive for tingling and sensory change.    Blood pressure 129/84, pulse 87, temperature 98 F (36.7 C), temperature source Oral, resp. rate 19, height '5\' 11"'  (1.803 m), weight 123.4 kg (272 lb 1 oz), SpO2 96 %. Physical Exam  Constitutional: He is oriented to person, place, and time. He appears well-developed and well-nourished.  HENT:  Head: Normocephalic.  Eyes: Pupils are equal, round, and reactive to light.  Neck: Normal range of motion.  Respiratory: Effort normal.  GI: Soft. Bowel sounds are normal.  Musculoskeletal: Normal range of motion.  Neurological: He is alert and oriented to person, place, and time. He has normal strength. GCS eye subscore is 4. GCS verbal subscore is 5. GCS motor subscore is 6.  Strength is 5 out of 5 iliopsoas, quads, hamstrings, gastric, into tibialis, and EHL.  Skin: Skin is warm and dry.     Assessment/Plan 80 year gentleman presents for decompression stable position procedure at L4-5.  Mckaylie Vasey P, MD 06/10/2017, 11:48 AM

## 2017-06-10 NOTE — Anesthesia Procedure Notes (Signed)
Procedure Name: Intubation Date/Time: 06/10/2017 12:11 PM Performed by: Julian ReilWELTY, Trevia Nop F Pre-anesthesia Checklist: Patient identified, Emergency Drugs available, Suction available, Patient being monitored and Timeout performed Patient Re-evaluated:Patient Re-evaluated prior to induction Oxygen Delivery Method: Circle system utilized Preoxygenation: Pre-oxygenation with 100% oxygen Induction Type: IV induction Ventilation: Mask ventilation without difficulty Laryngoscope Size: Miller and 3 Grade View: Grade I Tube type: Oral Tube size: 7.5 mm Number of attempts: 1 Airway Equipment and Method: Stylet Placement Confirmation: ETT inserted through vocal cords under direct vision,  positive ETCO2 and breath sounds checked- equal and bilateral Secured at: 23 cm Tube secured with: Tape Dental Injury: Teeth and Oropharynx as per pre-operative assessment  Comments: 4x4s bite block used, ETT and DL per [employer] Flores CRNA.

## 2017-06-10 NOTE — Anesthesia Postprocedure Evaluation (Signed)
Anesthesia Post Note  Patient: Albert Skinner  Procedure(s) Performed: Procedure(s) (LRB): Posterior Laterial Interbody Fusion - Lumbar four-Lumbar five (N/A)     Patient location during evaluation: PACU Anesthesia Type: General Level of consciousness: awake and alert, patient cooperative and oriented Pain management: pain level controlled (pain improving) Vital Signs Assessment: post-procedure vital signs reviewed and stable Respiratory status: spontaneous breathing, nonlabored ventilation, respiratory function stable and patient connected to nasal cannula oxygen Cardiovascular status: blood pressure returned to baseline and stable Postop Assessment: no apparent nausea or vomiting Anesthetic complications: no    Last Vitals:  Vitals:   06/10/17 1636 06/10/17 1658  BP: 102/67 129/80  Pulse: 91 87  Resp: 18 18  Temp:  36.9 C  SpO2: 98% 95%    Last Pain:  Vitals:   06/10/17 1635  TempSrc:   PainSc: 6       LLE Sensation: Full sensation (06/10/17 1701)   RLE Sensation: Full sensation (06/10/17 1701)      Nashia Remus,E. Jessaca Philippi

## 2017-06-10 NOTE — Progress Notes (Signed)
Rt placed pt on CPAP white box with pt mask from home. Placed pt on pressure of 12. Pt tolerating well. Educated pt how to self administer and take off. Advised to call if needed anything. RT to cont to monitor.

## 2017-06-10 NOTE — Op Note (Signed)
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  Postoperative diagnosis: Same  Procedure: #1 Gill decompression L4-5 with complete medial facetectomies radical foraminotomies and drilling off and under biting of the inferior medial pedicle at L4  #2 posterior lumbar interbody fusion L4-5 utilizing the globus rise expandable cage system packed with locally harvested autograft mixed with vivgen  #3 pedicle screw fixation utilizing the globus Creole and modular pedicle screw set L4-5 with 6.5 x 40 mm screws bilaterally  #4 open reduction spinal deformity L4-5  #5 posterior lateral arthrodesis L4-L5 utilizing autograft mixed  Surgeon: Trevino Wyatt  AsJillyn Hiddenst.: Verlin DikeKimberly Meyran  Anesthesia: Gen.  EBL: Minimal  History of present illness: 36 year old gentleman with long sitting back bilateral hip and leg pain rating down the hip lateral thigh and anterior shin. Workup revealed severe spinal stenosis grade 1 spinal listhesis with motion on flexion and extension films. Patient failed all forms of conservative treatment and so I recommended decompression sterilization procedure at L4-5. I extensively reviewed the risks and benefits of the operation the patient as well as perioperative course expectations of outcome alternatives of surgery and he understood and agreed to proceed forward.  Operative procedure: Patient brought into the or was discharged in general anesthesia positioned prone the Wilson frame his back was prepped and draped in routine sterile fashion preoperative x-ray localized the appropriate level so after infiltration of 10 mL lidocaine with epi a midline incision was made and Bovie light cautery was used to take down subcutaneous tissues and subperiosteal dissections care on the lamina of L4 and L5 bilaterally. Interoperative x-ray confirmed appropriate level there was markedly disruption of the interspinous  ligament and bilateral pars defects with an extensive amount of granulation tissue medially visualized. The spinolaminar complexes was also hypermobile. So I removed the spinous process performed central decompression performed completely facetectomies under bit the superior tickling facet bilaterally and then drilled off the inferomedial aspect pedicles at L4 and with a to the Kerrison punch under bit extensive medical granulation tissue over both L4 nerve roots. I performed radical foraminotomies of both the L4 and L5 nerve roots. Gained access lateral margins disc space and coag with the epidural veins. The spaces and cleanout radically bilaterally and utilizing sequential distraction with an 11 distractor in place a slight tenderness 17 expander cages. These were packed with local autograft mixed and after adequate endplate preparation discectomy been performed there cages were placed bilaterally with an extensive amount of autograft mix packed centrally. Then testing the pedicle screw placement using a high-speed drill and fluoroscopy pilot holes were drilled drill holes were cannulated probed tapped probed again and a 6 5 x 40 mm screws inserted bilaterally at L4-5. Postop fluoroscopy confirmed good position of all the implants. Was a go see her get meticulous hemostasis was maintained. Aggressive decortication was care on the lateral facet complexes and TPs the remainder the local autograft mixed pack posterior laterally then the rods were assembled top tightening nuts frank in place across it was placed. The wound was inspected and the foraminal reinspected confirm no migration of graft material and confirm patency. A question pattern was inspected the wound. Gelfoam a large abductor was placed extra was injected in the fascia and the wounds closed in layers with after Vicryl and a running 4 septic subcuticular in the skin Dermabond benzo and Steri-Strips and sterile dressing was applied patient recovered in  stable condition. At the end of case all needle counts and  sponge counts were correct.Marland Kitchen

## 2017-06-10 NOTE — Anesthesia Preprocedure Evaluation (Addendum)
Anesthesia Evaluation  Patient identified by MRN, date of birth, ID band Patient awake    Reviewed: Allergy & Precautions, NPO status , Patient's Chart, lab work & pertinent test results  History of Anesthesia Complications Negative for: history of anesthetic complications  Airway Mallampati: I  TM Distance: >3 FB Neck ROM: Full    Dental  (+) Dental Advisory Given   Pulmonary sleep apnea and Continuous Positive Airway Pressure Ventilation , former smoker (quit 9/17),    breath sounds clear to auscultation       Cardiovascular hypertension, Pt. on medications (-) angina Rhythm:Regular Rate:Normal     Neuro/Psych Back pain    GI/Hepatic GERD  Controlled,H/o fatty liver: mildly elevated LFTs   Endo/Other  Morbid obesity  Renal/GU Renal diseasenegative Renal ROS     Musculoskeletal   Abdominal (+) + obese,   Peds  Hematology negative hematology ROS (+)   Anesthesia Other Findings   Reproductive/Obstetrics                            Anesthesia Physical Anesthesia Plan  ASA: III  Anesthesia Plan: General   Post-op Pain Management:    Induction: Intravenous  PONV Risk Score and Plan: 3 and Ondansetron, Dexamethasone and Midazolam  Airway Management Planned: Oral ETT  Additional Equipment:   Intra-op Plan:   Post-operative Plan: Extubation in OR  Informed Consent: I have reviewed the patients History and Physical, chart, labs and discussed the procedure including the risks, benefits and alternatives for the proposed anesthesia with the patient or authorized representative who has indicated his/her understanding and acceptance.   Dental advisory given  Plan Discussed with: CRNA and Surgeon  Anesthesia Plan Comments: (Plan routine monitors, GETA)        Anesthesia Quick Evaluation

## 2017-06-10 NOTE — Transfer of Care (Signed)
Immediate Anesthesia Transfer of Care Note  Patient: Albert GumsRyan J Sabado  Procedure(s) Performed: Procedure(s): Posterior Laterial Interbody Fusion - Lumbar four-Lumbar five (N/A)  Patient Location: PACU  Anesthesia Type:General  Level of Consciousness: awake, alert  and patient cooperative  Airway & Oxygen Therapy: Patient Spontanous Breathing and Patient connected to face mask oxygen  Post-op Assessment: Report given to RN and Post -op Vital signs reviewed and stable  Post vital signs: Reviewed and stable  Last Vitals:  Vitals:   06/10/17 0934 06/10/17 1534  BP: 129/84   Pulse: 87   Resp: 19   Temp: 36.7 C 36.8 C  SpO2: 96%     Last Pain:  Vitals:   06/10/17 1534  TempSrc:   PainSc: 0-No pain      Patients Stated Pain Goal: 2 (06/10/17 1003)  Complications: No apparent anesthesia complications

## 2017-06-10 NOTE — Evaluation (Signed)
Physical Therapy Evaluation Patient Details Name: Albert GumsRyan J Skinner MRN: [id number] DOB: 1981/08/21 Today's Date: 06/10/2017   History of Present Illness  Pt is a 36 y/o male s/p L4-5 PLIF. PMH includes anxiety, HTN, and OSA on CPAP.   Clinical Impression  Patient is s/p above surgery resulting in the deficits listed below (see PT Problem List). PTA, pt was independent with functional mobility. Upon eval, pt presenting with increased back pain and stiffness and decreased mobility. Pt requiring supervision for safety during mobility. Will have assist from wife at d/c and will not need any follow up PT services. Will need to ensure safety with stair navigation in next session.  Patient will benefit from skilled PT to increase their independence and safety with mobility (while adhering to their precautions) to allow discharge to the venue listed below. Will continue to follow acutely.      Follow Up Recommendations No PT follow up    Equipment Recommendations  None recommended by PT    Recommendations for Other Services       Precautions / Restrictions Precautions Precautions: Back Precaution Booklet Issued: Yes (comment) Precaution Comments: Reviewed back precautions with pt.  Required Braces or Orthoses: Spinal Brace Spinal Brace: Lumbar corset;Applied in standing position Restrictions Weight Bearing Restrictions: No      Mobility  Bed Mobility               General bed mobility comments: Standing in room with NT upon entry. Reviewed log roll technique with pt.   Transfers Overall transfer level: Needs assistance Equipment used: None Transfers: Sit to/from Stand Sit to Stand: Supervision         General transfer comment: Supervision for safety.   Ambulation/Gait Ambulation/Gait assistance: Supervision Ambulation Distance (Feet): 400 Feet Assistive device: None Gait Pattern/deviations: Step-through pattern Gait velocity: Decreased Gait velocity interpretation:  Below normal speed for age/gender General Gait Details: Slow, gaurded gait, however, overall steady. Pt reporting stiffness in back secondary to surgery. Educated about generalized walking program to perform at home.   Stairs            Wheelchair Mobility    Modified Rankin (Stroke Patients Only)       Balance Overall balance assessment: Needs assistance Sitting-balance support: No upper extremity supported;Feet supported Sitting balance-Leahy Scale: Good     Standing balance support: No upper extremity supported;During functional activity Standing balance-Leahy Scale: Good                               Pertinent Vitals/Pain Pain Assessment: Faces Faces Pain Scale: Hurts a little bit Pain Location: back  Pain Descriptors / Indicators: Grimacing;Aching (stiff ) Pain Intervention(s): Limited activity within patient's tolerance;Monitored during session;Repositioned    Home Living Family/patient expects to be discharged to:: Private residence Living Arrangements: Spouse/significant other Available Help at Discharge: Family;Available 24 hours/day Type of Home: House Home Access: Stairs to enter Entrance Stairs-Rails: None Entrance Stairs-Number of Steps: 2 Home Layout: Two level;Able to live on main level with bedroom/bathroom Home Equipment: None      Prior Function Level of Independence: Independent               Hand Dominance        Extremity/Trunk Assessment   Upper Extremity Assessment Upper Extremity Assessment: Overall WFL for tasks assessed    Lower Extremity Assessment Lower Extremity Assessment: Overall WFL for tasks assessed    Cervical / Trunk Assessment Cervical /  Trunk Assessment: Other exceptions Cervical / Trunk Exceptions: s/p PLIF   Communication   Communication: No difficulties  Cognition Arousal/Alertness: Awake/alert Behavior During Therapy: WFL for tasks assessed/performed Overall Cognitive Status: Within  Functional Limits for tasks assessed                                        General Comments General comments (skin integrity, edema, etc.): Pt's wife present during session.     Exercises     Assessment/Plan    PT Assessment Patient needs continued PT services  PT Problem List Decreased mobility;Decreased knowledge of precautions;Pain       PT Treatment Interventions Gait training;Stair training;Functional mobility training;Therapeutic exercise;Therapeutic activities;Balance training;Neuromuscular re-education;Patient/family education    PT Goals (Current goals can be found in the Care Plan section)  Acute Rehab PT Goals Patient Stated Goal: to go home  PT Goal Formulation: With patient Time For Goal Achievement: 06/17/17 Potential to Achieve Goals: Good    Frequency Min 5X/week   Barriers to discharge        Co-evaluation               AM-PAC PT "6 Clicks" Daily Activity  Outcome Measure Difficulty turning over in bed (including adjusting bedclothes, sheets and blankets)?: None Difficulty moving from lying on back to sitting on the side of the bed? : None Difficulty sitting down on and standing up from a chair with arms (e.g., wheelchair, bedside commode, etc,.)?: None Help needed moving to and from a bed to chair (including a wheelchair)?: None Help needed walking in hospital room?: None Help needed climbing 3-5 steps with a railing? : A Little 6 Click Score: 23    End of Session Equipment Utilized During Treatment: Back brace Activity Tolerance: Patient tolerated treatment well Patient left: in bed;with call bell/phone within reach;with family/visitor present (sitting EOB ) Nurse Communication: Mobility status PT Visit Diagnosis: Other abnormalities of gait and mobility (R26.89);Pain Pain - part of body:  (back )    Time: 0981-1914 PT Time Calculation (min) (ACUTE ONLY): 11 min   Charges:   PT Evaluation $PT Eval Low Complexity: 1  Low     PT G Codes:        Gladys Damme, PT, DPT  Acute Rehabilitation Services  Pager: [phone]   Lehman Prom 06/10/2017, 5:58 PM

## 2017-06-11 MED ORDER — OXYCODONE-ACETAMINOPHEN 5-325 MG PO TABS: 1.0000 | ORAL_TABLET | ORAL | 0 refills | Status: DC | PRN

## 2017-06-11 MED ORDER — CYCLOBENZAPRINE HCL 10 MG PO TABS: 10.0000 mg | ORAL_TABLET | Freq: Three times a day (TID) | ORAL | 0 refills | Status: AC | PRN

## 2017-06-11 NOTE — Progress Notes (Signed)
Physical Therapy Treatment Patient Details Name: Albert Skinner MRN: [id number] DOB: March 19, 1981 Today's Date: 06/11/2017    History of Present Illness Pt is a 36 y/o male s/p L4-5 PLIF. PMH includes anxiety, HTN, and OSA on CPAP.     PT Comments    Pt making excellent progress with mobility. Pt successfully completed stair training this session. No further acute PT needs identified at this time. PT signing off.   Follow Up Recommendations  No PT follow up     Equipment Recommendations  None recommended by PT    Recommendations for Other Services       Precautions / Restrictions Precautions Precautions: Back Precaution Booklet Issued: Yes (comment) Precaution Comments: pt able to recall 3/3 precautions Required Braces or Orthoses: Spinal Brace Spinal Brace: Lumbar corset;Applied in standing position Restrictions Weight Bearing Restrictions: No    Mobility  Bed Mobility Overal bed mobility: Needs Assistance Bed Mobility: Rolling;Sidelying to Sit Rolling: Supervision Sidelying to sit: Supervision       General bed mobility comments: perfect log roll technique, supervision for safety, HOB flat  Transfers Overall transfer level: Needs assistance Equipment used: None Transfers: Sit to/from Stand Sit to Stand: Supervision         General transfer comment: Supervision for safety.   Ambulation/Gait Ambulation/Gait assistance: Supervision Ambulation Distance (Feet): 400 Feet Assistive device: None Gait Pattern/deviations: Step-through pattern Gait velocity: WFL Gait velocity interpretation: Below normal speed for age/gender General Gait Details: no instability, LOB or gait abnormalities   Stairs Stairs: Yes   Stair Management: No rails;One rail Left;Alternating pattern;Forwards Number of Stairs: 12 General stair comments: no instability, supervision for safety  Wheelchair Mobility    Modified Rankin (Stroke Patients Only)       Balance Overall  balance assessment: Needs assistance Sitting-balance support: No upper extremity supported;Feet supported Sitting balance-Leahy Scale: Good     Standing balance support: No upper extremity supported;During functional activity Standing balance-Leahy Scale: Good                              Cognition Arousal/Alertness: Awake/alert Behavior During Therapy: WFL for tasks assessed/performed Overall Cognitive Status: Within Functional Limits for tasks assessed                                        Exercises      General Comments        Pertinent Vitals/Pain Pain Assessment: 0-10 Pain Score: 3  Pain Location: back  Pain Descriptors / Indicators: Sore Pain Intervention(s): Monitored during session;Repositioned    Home Living                      Prior Function            PT Goals (current goals can now be found in the care plan section) Acute Rehab PT Goals PT Goal Formulation: With patient Time For Goal Achievement: 06/17/17 Potential to Achieve Goals: Good Progress towards PT goals: Progressing toward goals    Frequency    Min 5X/week      PT Plan Current plan remains appropriate    Co-evaluation              AM-PAC PT "6 Clicks" Daily Activity  Outcome Measure  Difficulty turning over in bed (including adjusting bedclothes, sheets and blankets)?: None Difficulty moving from  lying on back to sitting on the side of the bed? : None Difficulty sitting down on and standing up from a chair with arms (e.g., wheelchair, bedside commode, etc,.)?: None Help needed moving to and from a bed to chair (including a wheelchair)?: None Help needed walking in hospital room?: None Help needed climbing 3-5 steps with a railing? : None 6 Click Score: 24    End of Session Equipment Utilized During Treatment: Back brace Activity Tolerance: Patient tolerated treatment well Patient left: with call bell/phone within reach;Other  (comment) (in bathroom) Nurse Communication: Mobility status PT Visit Diagnosis: Other abnormalities of gait and mobility (R26.89);Pain Pain - part of body:  (back)     Time: 8295-6213 PT Time Calculation (min) (ACUTE ONLY): 10 min  Charges:  $Gait Training: 8-22 mins                    G Codes:       [place], [place], [place] [phone]    Alessandra Bevels Case Vassell 06/11/2017, 8:38 AM

## 2017-06-11 NOTE — Discharge Summary (Signed)
Physician Discharge Summary  Patient ID: Albert Skinner MRN: [id number] DOB/AGE: [birth year] 36 y.o.  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 The patient tolerated the procedure well and was taken to the recovery room and then to the floor in stable condition. The hospital course was routine. There were no complications. The wound remained clean dry and intact. Pt had appropriate back soreness. No complaints of leg pain or new N/T/W. The patient remained afebrile with stable vital signs, and tolerated a regular diet. The patient continued to increase activities, and pain was well controlled with oral pain medications.   Consults: None  Significant Diagnostic Studies:  Results for orders placed or performed during the hospital encounter of 06/08/17  Surgical pcr screen  Result Value Ref Range   MRSA, PCR NEGATIVE NEGATIVE   Staphylococcus aureus NEGATIVE NEGATIVE  CBC  Result Value Ref Range   WBC 9.4 4.0 - 10.5 K/uL   RBC 4.97 4.22 - 5.81 MIL/uL   Hemoglobin 15.8 13.0 - 17.0 g/dL   HCT 40.9 81.1 - 91.4 %   MCV 93.6 78.0 - 100.0 fL   MCH 31.8 26.0 - 34.0 pg   MCHC 34.0 30.0 - 36.0 g/dL   RDW 78.2 95.6 - 21.3 %   Platelets 157 150 - 400 K/uL  Comprehensive metabolic panel  Result Value Ref Range   Sodium 136 135 - 145 mmol/L   Potassium 3.6 3.5 - 5.1 mmol/L   Chloride 103 101 - 111 mmol/L   CO2 24 22 - 32 mmol/L   Glucose, Bld 86 65 - 99 mg/dL   BUN 12 6 - 20 mg/dL   Creatinine, Ser 0.86 0.61 - 1.24 mg/dL   Calcium 9.6 8.9 - 57.8 mg/dL   Total Protein 7.2 6.5 - 8.1 g/dL    Albumin 4.2 3.5 - 5.0 g/dL   AST 48 (H) 15 - 41 U/L   ALT 67 (H) 17 - 63 U/L   Alkaline Phosphatase 82 38 - 126 U/L   Total Bilirubin 0.8 0.3 - 1.2 mg/dL   GFR calc non Af Amer >60 >60 mL/min   GFR calc Af Amer >60 >60 mL/min   Anion gap 9 5 - 15  Type and screen All Cardiac and thoracic surgeries, spinal fusions, myomectomies, craniotomies, colon & liver resections, total joint revisions, same day c-section with placenta previa or accreta.  Result Value Ref Range   ABO/RH(D) O POS    Antibody Screen NEG    Sample Expiration 06/22/2017    Extend sample reason NO TRANSFUSIONS OR PREGNANCY IN THE PAST 3 MONTHS   ABO/Rh  Result Value Ref Range   ABO/RH(D) O POS     Dg Lumbar Spine 2-3 Views  Result Date: 06/10/2017 CLINICAL DATA:  Four-5 PLIF.  Reported fluoro time 35 seconds EXAM: LUMBAR SPINE - 2-3 VIEW; DG C-ARM 61-120 MIN COMPARISON:  Preoperative lumbar spine series of May 25, 2017 FINDINGS: AP and lateral fluoro spot views reveal pedicle screws at L4 and L5. Connecting rods have not yet been placed. An intradiscal device is present at L4-5. Pre-existing anterolisthesis of L4 with respect L5 is noted. IMPRESSION: Intraoperative fluoro spot images revealing  the patient to be undergoing L4-5 PLIF. No immediate complication is observed. Electronically Signed   By: David  [place] M.D.   On: 06/10/2017 14:43   Dg C-arm 61-120 Min  Result Date: 06/10/2017 CLINICAL DATA:  Four-5 PLIF.  Reported fluoro time 35 seconds EXAM: LUMBAR SPINE - 2-3 VIEW; DG C-ARM 61-120 MIN COMPARISON:  Preoperative lumbar spine series of May 25, 2017 FINDINGS: AP and lateral fluoro spot views reveal pedicle screws at L4 and L5. Connecting rods have not yet been placed. An intradiscal device is present at L4-5. Pre-existing anterolisthesis of L4 with respect L5 is noted. IMPRESSION: Intraoperative fluoro spot images revealing the patient to be undergoing L4-5 PLIF. No immediate complication is observed.  Electronically Signed   By: David  [place] M.D.   On: 06/10/2017 14:43    Antibiotics:  Anti-infectives    Start     Dose/Rate Route Frequency Ordered Stop   06/10/17 2000  ceFAZolin (ANCEF) IVPB 2g/100 mL premix     2 g 200 mL/hr over 30 Minutes Intravenous Every 8 hours 06/10/17 1658 06/12/17 1959   06/10/17 1448  vancomycin (VANCOCIN) powder  Status:  Discontinued       As needed 06/10/17 1448 06/10/17 1531   06/10/17 1247  bacitracin 50,000 Units in sodium chloride irrigation 0.9 % 500 mL irrigation  Status:  Discontinued       As needed 06/10/17 1247 06/10/17 1531   06/10/17 0944  ceFAZolin (ANCEF) 2-4 GM/100ML-% IVPB    Comments:  Forte, Lindsi   : cabinet override      06/10/17 0944 06/10/17 1215   06/10/17 0940  ceFAZolin (ANCEF) IVPB 2g/100 mL premix     2 g 200 mL/hr over 30 Minutes Intravenous On call to O.R. 06/10/17 0940 06/10/17 1215      Discharge Exam: Blood pressure 114/81, pulse 77, temperature 97.6 F (36.4 C), temperature source Oral, resp. rate 18, height  (1.803 m), weight 123.4 kg (272 lb 1 oz), SpO2 98 %. Neurologic: Grossly normal Ambulating and voiding well  Discharge Medications:   Allergies as of 06/11/2017   No Known Allergies     Medication List    TAKE these medications   ALPRAZolam 0.25 MG tablet Commonly known as:  XANAX Take 0.25 mg by mouth 2 (two) times daily as needed for anxiety.   amLODipine 10 MG tablet Commonly known as:  NORVASC Take 10 mg by mouth daily.   calcium carbonate 500 MG chewable tablet Commonly known as:  TUMS - dosed in mg elemental calcium Chew 2 tablets by mouth 3 (three) times daily as needed for indigestion or heartburn.   cyclobenzaprine 10 MG tablet Commonly known as:  FLEXERIL Take 1 tablet (10 mg total) by mouth 3 (three) times daily as needed for muscle spasms.   hydrochlorothiazide 25 MG tablet Commonly known as:  HYDRODIURIL Take 25 mg by mouth daily.   oxyCODONE-acetaminophen 10-325 MG  tablet Commonly known as:  PERCOCET Take 1 tablet by mouth every 6 (six) hours as needed for pain. What changed:  Another medication with the same name was added. Make sure you understand how and when to take each.   oxyCODONE-acetaminophen 5-325 MG tablet Commonly known as:  PERCOCET/ROXICET Take 1-2 tablets by mouth every 4 (four) hours as needed for moderate pain. What changed:  You were already taking a medication with the same name, and this prescription was added. Make sure you understand how and when to take each.   sertraline 100 MG  tablet Commonly known as:  ZOLOFT Take 100 mg by mouth at bedtime.   zolpidem 10 MG tablet Commonly known as:  AMBIEN Take 10 mg by mouth at bedtime as needed for sleep.            Discharge Care Instructions        Start     Ordered   06/11/17 0000  cyclobenzaprine (FLEXERIL) 10 MG tablet  3 times daily PRN     06/11/17 0617   06/11/17 0000  oxyCODONE-acetaminophen (PERCOCET/ROXICET) 5-325 MG tablet  Every 4 hours PRN     06/11/17 0617   06/11/17 0000  Increase activity slowly     06/11/17 0617   06/11/17 0000  Diet - low sodium heart healthy     06/11/17 0617   06/11/17 0000  Lifting restrictions    Comments:  No more than 8 lbs   06/11/17 0617   06/11/17 0000   Remove dressing in 72 hours     06/11/17 0617   06/11/17 0000  Call MD for:  extreme fatigue     06/11/17 0617   06/11/17 0000  Call MD for:  persistant dizziness or light-headedness     06/11/17 0617   06/11/17 0000  Call MD for:  hives     06/11/17 0617   06/11/17 0000  Call MD for:  difficulty breathing, headache or visual disturbances     06/11/17 0617   06/11/17 0000  Call MD for:  redness, tenderness, or signs of infection (pain, swelling, redness, odor or green/yellow discharge around incision site)     06/11/17 0617   06/11/17 0000  Call MD for:  severe uncontrolled pain     06/11/17 0617   06/11/17 0000  Call MD for:  persistant nausea and vomiting      06/11/17 0617   06/11/17 0000  Call MD for:  temperature >100.4     06/11/17 0617   06/11/17 0000  Driving Restrictions    Comments:  No driving for 2 weeks   96/04/54 0617      Disposition: home   Final Dx: same as admitting  Discharge Instructions     Remove dressing in 72 hours    Complete by:  As directed    Call MD for:  difficulty breathing, headache or visual disturbances    Complete by:  As directed    Call MD for:  extreme fatigue    Complete by:  As directed    Call MD for:  hives    Complete by:  As directed    Call MD for:  persistant dizziness or light-headedness    Complete by:  As directed    Call MD for:  persistant nausea and vomiting    Complete by:  As directed    Call MD for:  redness, tenderness, or signs of infection (pain, swelling, redness, odor or green/yellow discharge around incision site)    Complete by:  As directed    Call MD for:  severe uncontrolled pain    Complete by:  As directed    Call MD for:  temperature >100.4    Complete by:  As directed    Diet - low sodium heart healthy    Complete by:  As directed    Driving Restrictions    Complete by:  As directed    No driving for 2 weeks   Increase activity slowly    Complete by:  As directed    Lifting  restrictions    Complete by:  As directed    No more than 8 lbs         Signed: Tiana Loft [facility] 06/11/2017, 6:17 AM

## 2017-06-11 NOTE — Care Management Note (Signed)
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  Expected Discharge Date:  06/11/17               Expected Discharge Plan:  Home/Self Care  In-House Referral:     Discharge planning Services  CM Consult  Post Acute Care Choice:    Choice offered to:     DME Arranged:    DME Agency:     HH Arranged:    HH Agency:     Status of Service:  Completed, signed off  If discussed at [employer] of Stay Meetings, dates discussed:    Additional Comments:  Lawerance Sabal, RN 06/11/2017, 11:12 AM

## 2017-06-11 NOTE — Progress Notes (Signed)
Patient alert and oriented, mae's well, voiding adequate amount of urine, swallowing without difficulty, no c/o pain at time of discharge. Patient discharged home with family. Script and discharged instructions given to patient. Patient and family stated understanding of instructions given. Patient has an appointment with Dr. Cram 

## 2018-01-11 ENCOUNTER — Other Ambulatory Visit: Payer: Self-pay | Admitting: Neurosurgery

## 2018-01-11 DIAGNOSIS — M5440 Lumbago with sciatica, unspecified side: Secondary | ICD-10-CM

## 2018-01-14 ENCOUNTER — Ambulatory Visit
Admission: RE | Admit: 2018-01-14 | Discharge: 2018-01-14 | Disposition: A | Discharge status: Home / Self Care | Payer: Managed Care, Other (non HMO) | Source: Ambulatory Visit | Attending: Neurosurgery | Admitting: Neurosurgery

## 2018-01-14 DIAGNOSIS — M5440 Lumbago with sciatica, unspecified side: Secondary | ICD-10-CM

## 2018-11-07 IMAGING — CT CT L SPINE W/O CM
3 of 4 series · 12 of 33 positions shown, 14 images · non-contrast
Comparison: Prior radiograph from 01/04/2018 as well as previous
MRI from 05/19/2017.

CLINICAL DATA: Initial evaluation for low back pain since [DATE],
possible lifting injury.

EXAM:
CT LUMBAR SPINE WITHOUT CONTRAST
TECHNIQUE: Multidetector CT imaging of the lumbar spine was performed without
intravenous contrast administration. Multiplanar CT image
reconstructions were also generated.

[Series 3: l-spine 2.00 br40 s3 lspine st · axial · 0.26mm/px · z∈[+1186,+1336]mm · 4 of 112 slices shown, 5 images]
[im 19/112  soft-tissue]
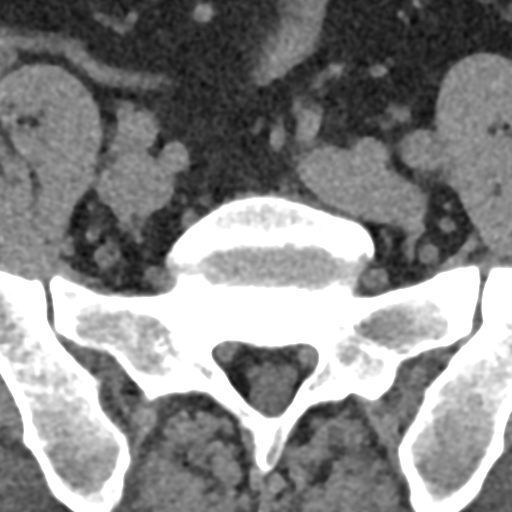
[im 19/112  bone]
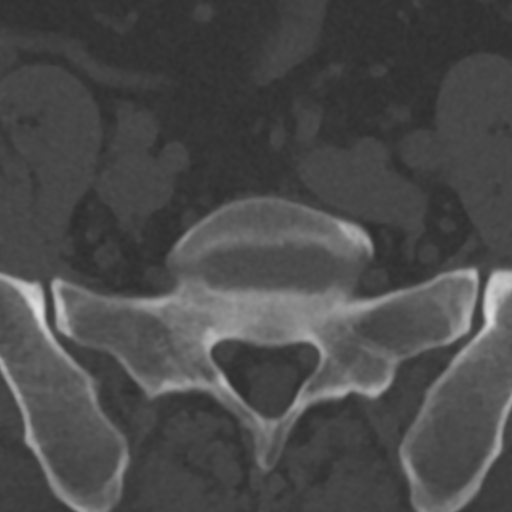
[im 38/112  bone]
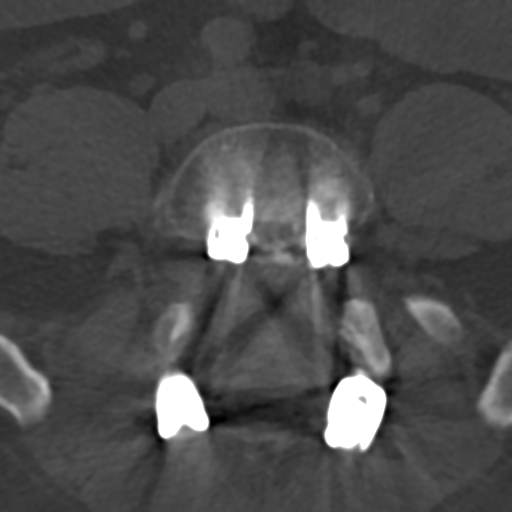
[im 75/112  bone]
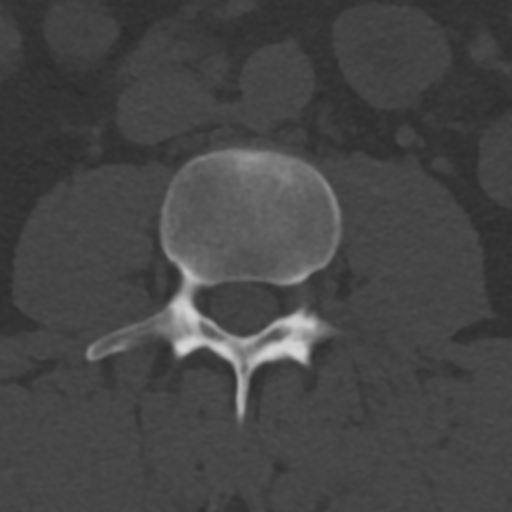
[im 93/112  bone]
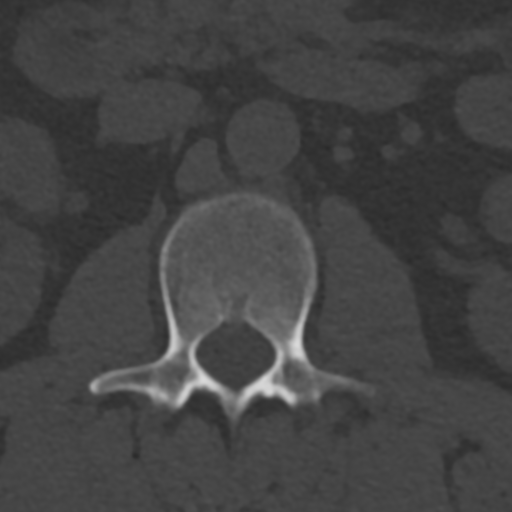

[Series 5: l-spine 2.00 br60 s3 sag sag bone · sagittal · 0.27mm/px · 5 of 80 slices shown, 6 images]
[im 27/80  bone]
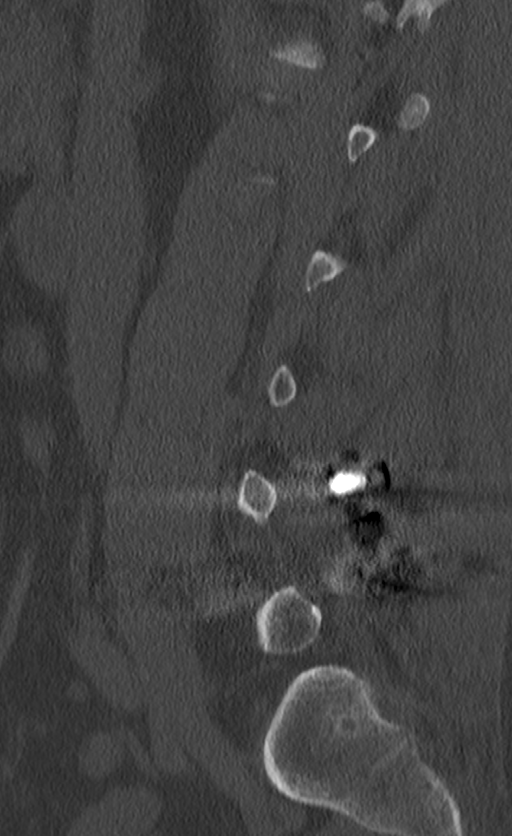
[im 33/80  bone]
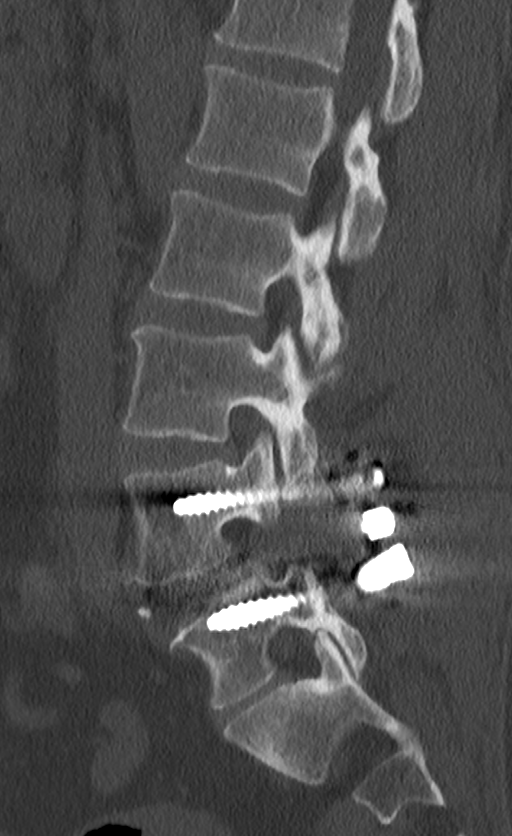
[im 40/80  soft-tissue]
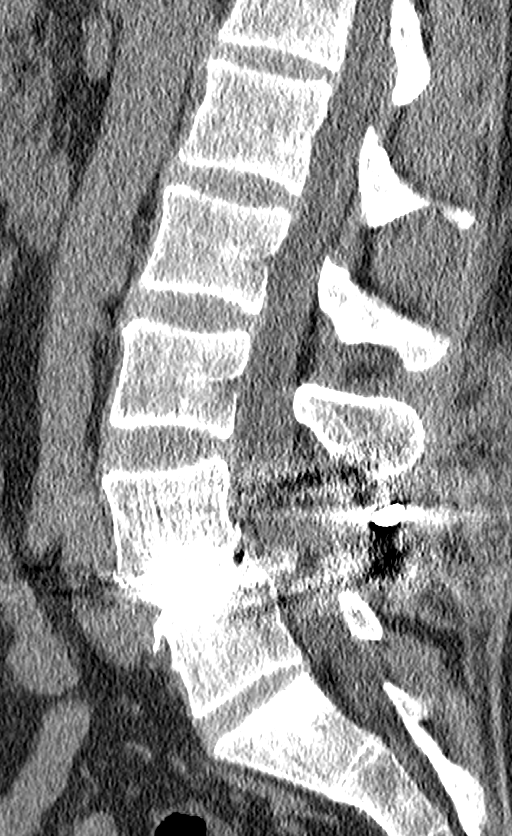
[im 40/80  bone]
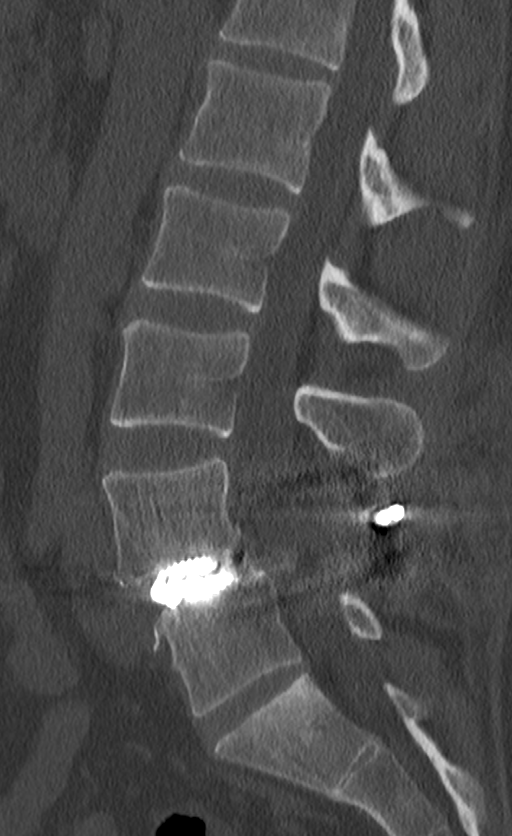
[im 47/80  bone]
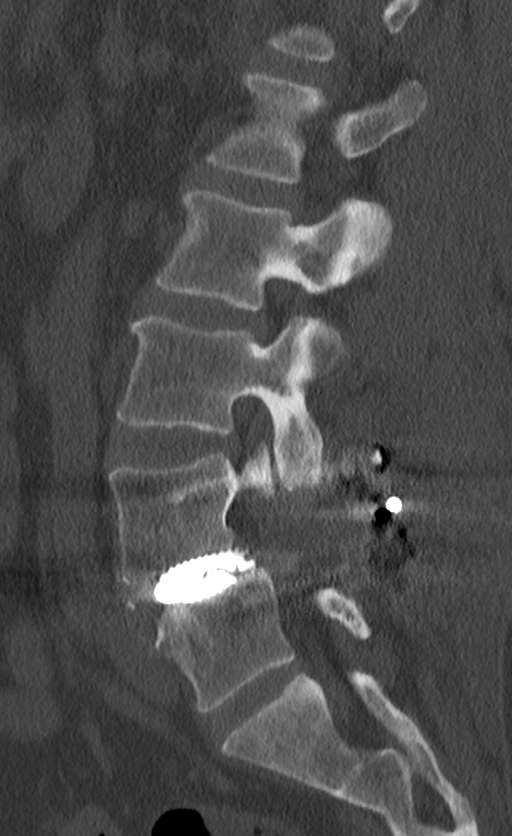
[im 53/80  bone]
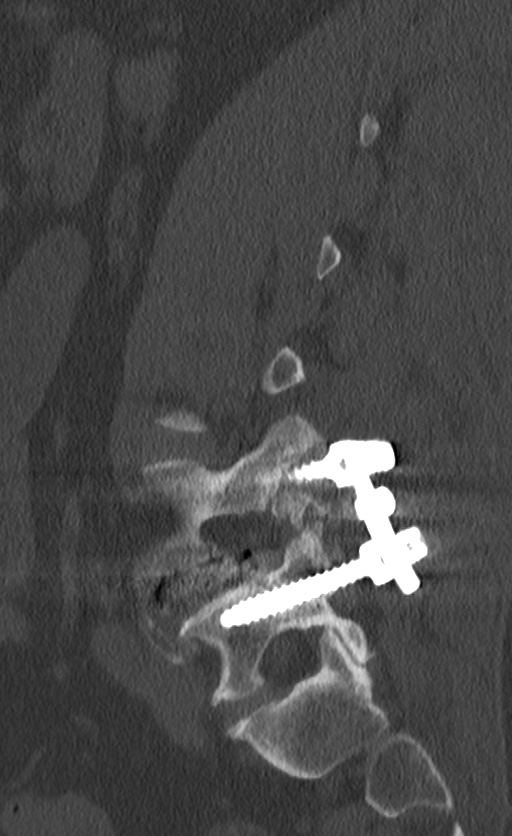

[Series 7: l-spine 2.00 br60 s3 cor cor bone · coronal · 0.32mm/px · 3 of 81 slices shown]
[im 17/81  bone]
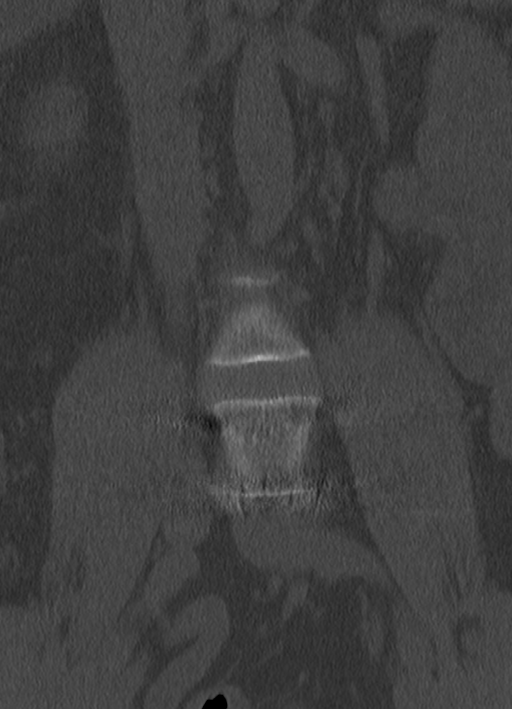
[im 33/81  bone]
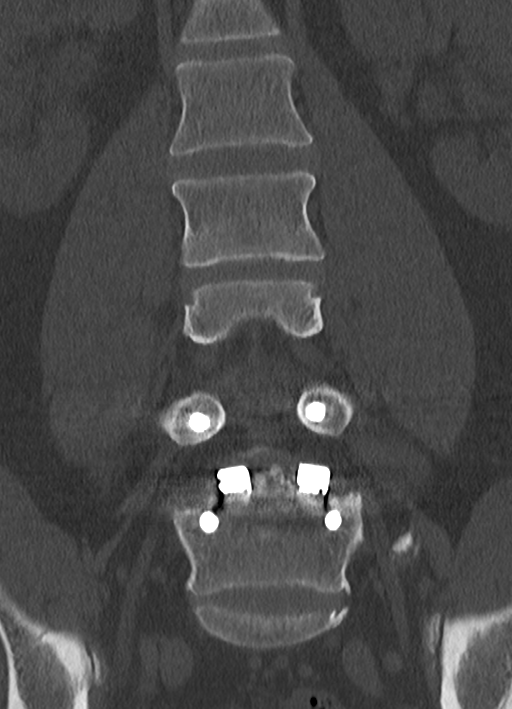
[im 49/81  bone]
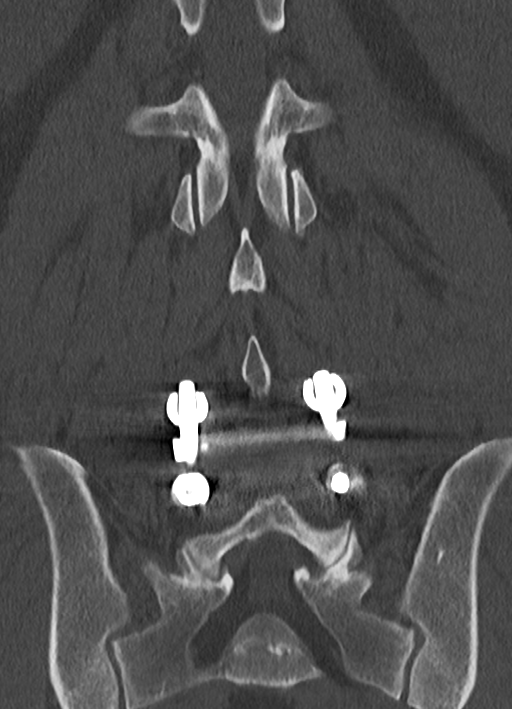

[12 of 33 positions shown; findings below may reference images not displayed]

FINDINGS: Segmentation: Normal segmentation. Lowest well-formed disc labeled
the L5-S1 level. L5 vertebral body partially sacralized on the left.

Alignment: Chronic bilateral pars defects at L4 with associated 5 mm
spondylolisthesis of L4 on L5, relatively stable from previous MRI.
Vertebral bodies otherwise normally aligned with preservation of the
normal lumbar lordosis.

Vertebrae: Vertebral body heights are well maintained without
evidence for acute or chronic fracture. No discrete lytic or blastic
osseous lesions. SI joints symmetric and normal in appearance
bilaterally.

Patient is status post PLIF at the L4-5 level. Interbody device in
place and appears well positioned. There appears to be solid
arthrodesis with minimal subsidence at the inferior endplate of L4.
Bilateral transpedicular screws with interlocking rods in place at
L4 and L5. Screws are well positioned without violation of the
spinal canal or adjacent intervertebral disc spaces. Hardware intact
without evidence for fracture or other complication. Mild lucency
about the proximal aspect of the left L4 pedicle screw, suggesting
loosening (series 7, image 43). No other significant periprosthetic
lucency identified.

Paraspinal and other soft tissues: Paraspinous soft tissues within
normal limits. Visualized visceral structures are normal.

Disc levels:

T12-L1: Unremarkable.

L1-2:  Unremarkable.

L2-3:  Unremarkable.

L3-4: No significant disc bulge or disc protrusion. Minimal
bilateral facet hypertrophy. No canal or foraminal stenosis.

L4-5: Stable 5 mm anterolisthesis. Postoperative changes from prior
posterior and interbody fusion with wide posterior decompression. No
appreciable residual spinal stenosis. Foramina appear widely patent.

L5-S1:  Unremarkable.
IMPRESSION: 1. Postoperative changes from prior PLIF at L4-5 with solid
arthrodesis. Stable 5 mm spondylolisthesis of L4 on L5 without
interval slippage. No residual canal or foraminal stenosis.
2. Mild lucency about the proximal aspect of the left L4 pedicular
screw, which may reflect loosening. No other hardware complication
identified.

## 2019-09-01 ENCOUNTER — Other Ambulatory Visit: Payer: Self-pay | Admitting: Neurosurgery

## 2019-09-01 DIAGNOSIS — M4316 Spondylolisthesis, lumbar region: Secondary | ICD-10-CM

## 2019-09-08 ENCOUNTER — Ambulatory Visit
Admission: RE | Admit: 2019-09-08 | Discharge: 2019-09-08 | Disposition: A | Discharge status: Home / Self Care | Payer: BC Managed Care – PPO | Source: Ambulatory Visit | Attending: Neurosurgery | Admitting: Neurosurgery

## 2019-09-08 DIAGNOSIS — M4316 Spondylolisthesis, lumbar region: Secondary | ICD-10-CM

## 2020-07-01 IMAGING — CT CT L SPINE W/O CM
3 of 4 series · 11 of 33 positions shown, 13 images · non-contrast
Comparison: lumbar spine CT 01/14/2018. Tsung Lin Keai lumbar
MRI 05/19/2017.

CLINICAL DATA: 38-year-old male with prior fusion in 5090. Six
weeks of low back pain. No recent injury.

EXAM:
CT LUMBAR SPINE WITHOUT CONTRAST
TECHNIQUE: Multidetector CT imaging of the lumbar spine was performed without
intravenous contrast administration. Multiplanar CT image
reconstructions were also generated.

[Series 3: l-spine 2.00 br40 s3 lspine st · axial · 0.29mm/px · z∈[+1294,+1450]mm · 3 of 117 slices shown, 4 images]
[im 20/117  soft-tissue]
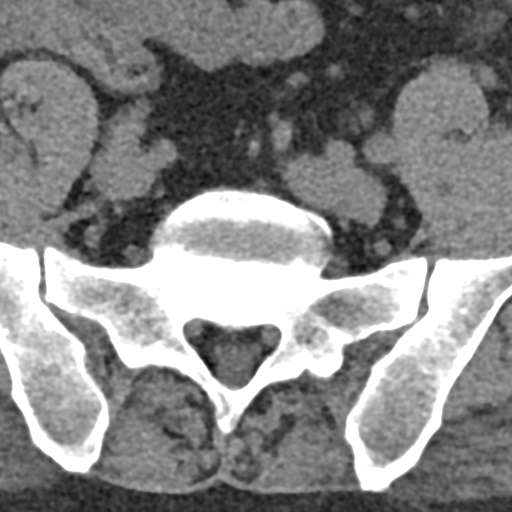
[im 20/117  bone]
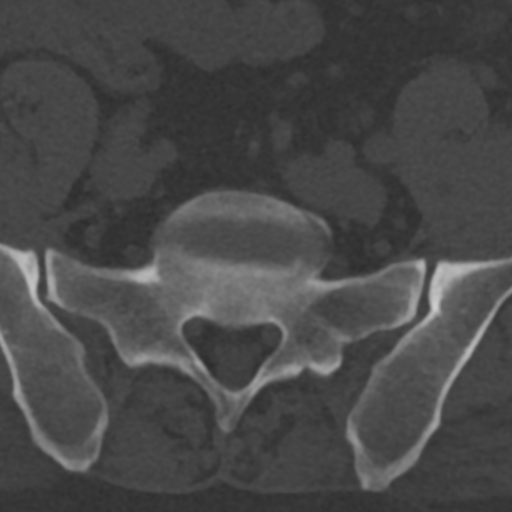
[im 59/117  bone]
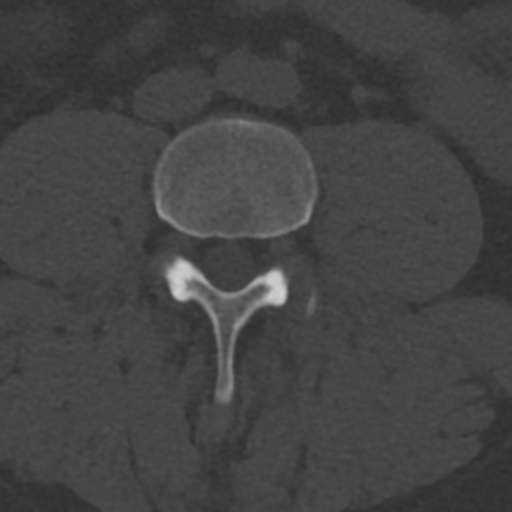
[im 97/117  bone]
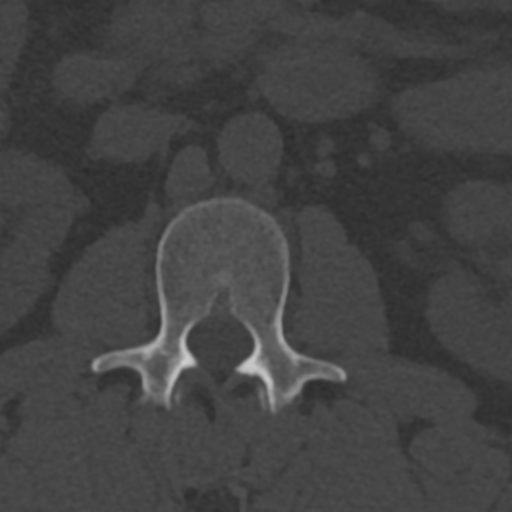

[Series 5: l-spine 2.00 br60 s3 sag bone · sagittal · 0.29mm/px · 5 of 76 slices shown, 6 images]
[im 26/76  bone]
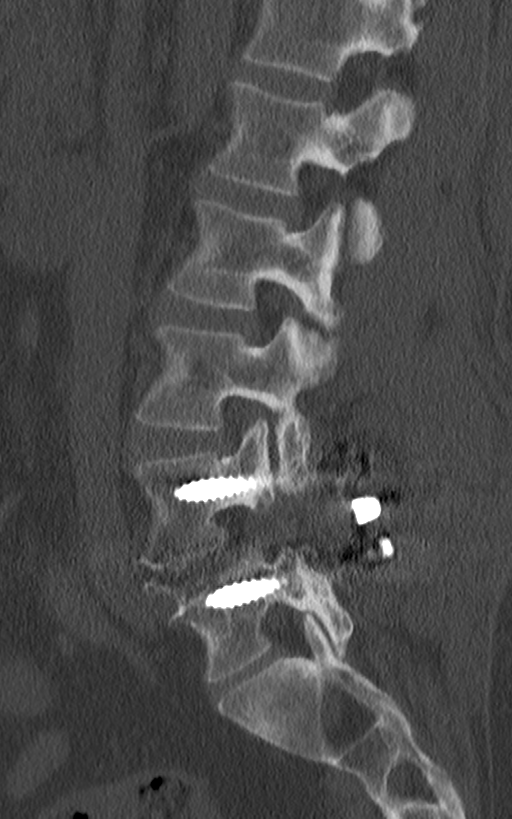
[im 32/76  bone]
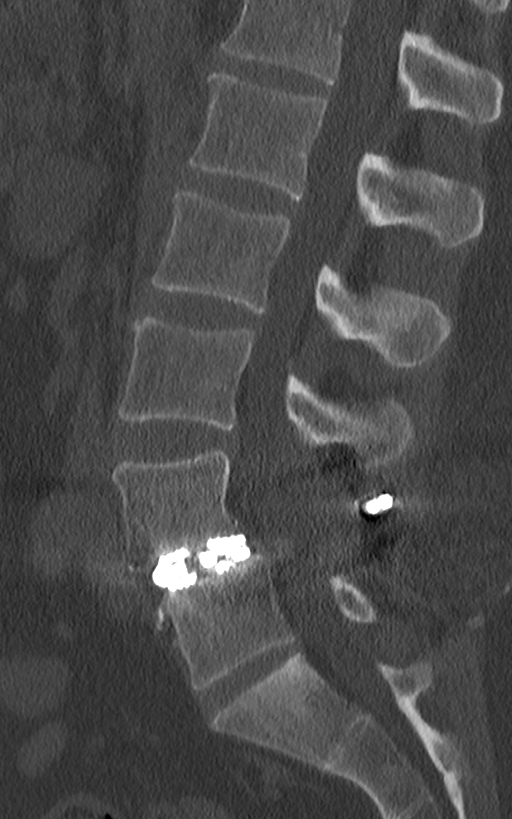
[im 38/76  soft-tissue]
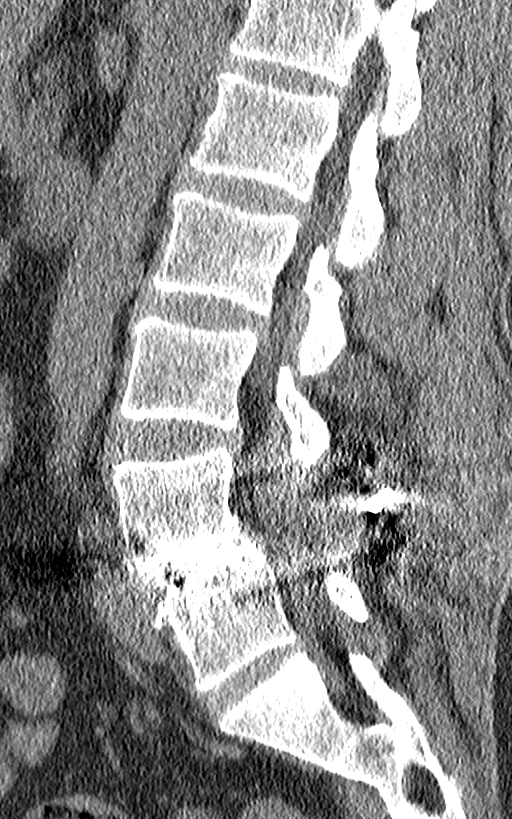
[im 38/76  bone]
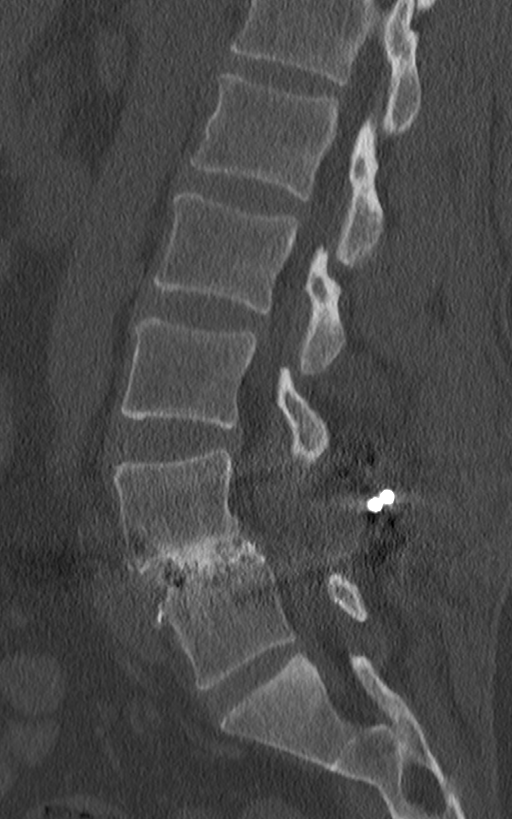
[im 44/76  bone]
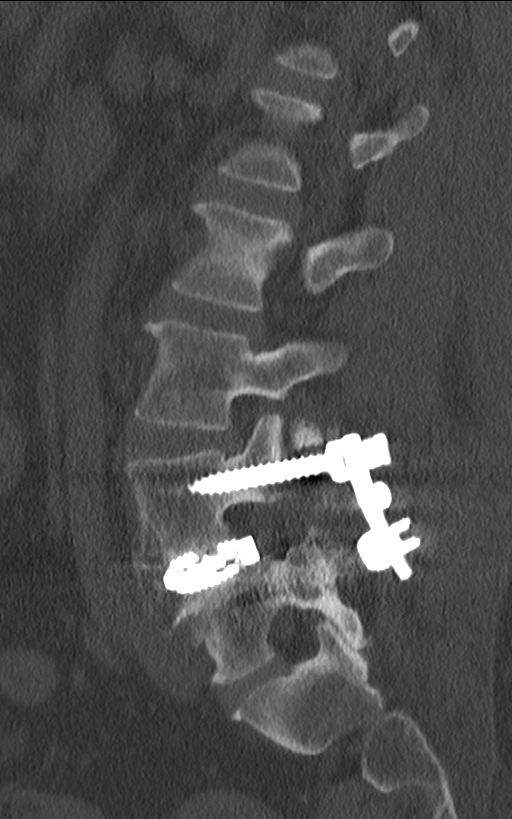
[im 51/76  bone]
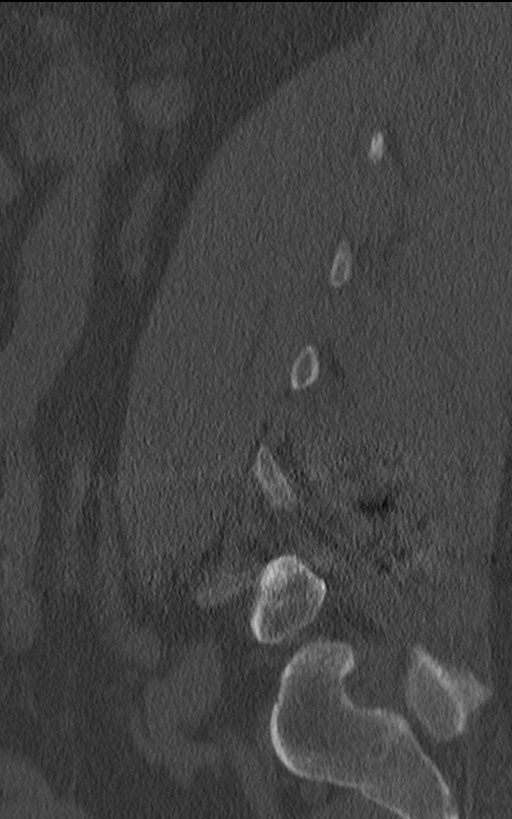

[Series 7: l-spine 2.00 br60 s3 cor bone · coronal · 0.30mm/px · 3 of 73 slices shown]
[im 15/73  bone]
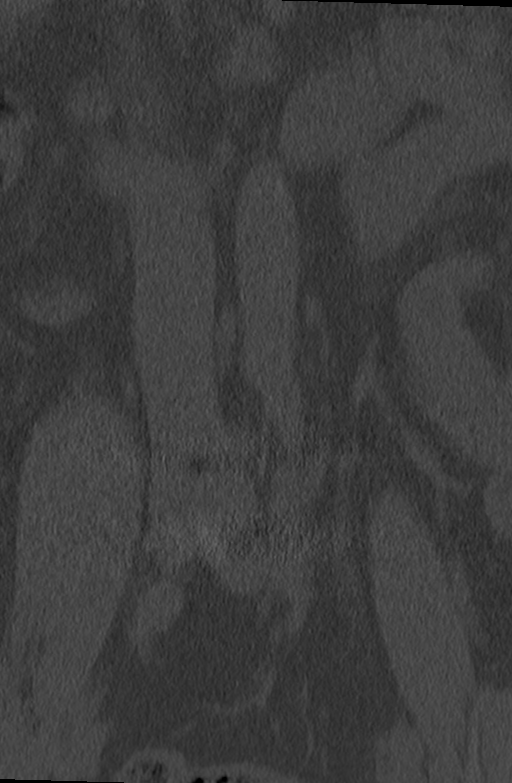
[im 29/73  bone]
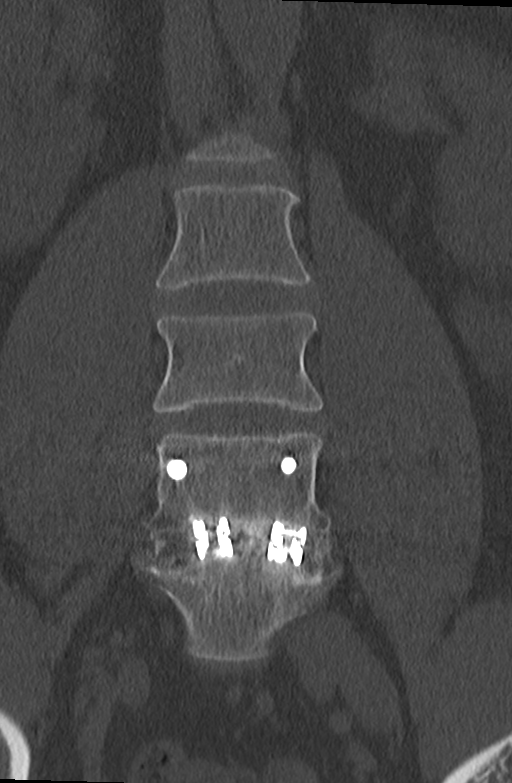
[im 44/73  bone]
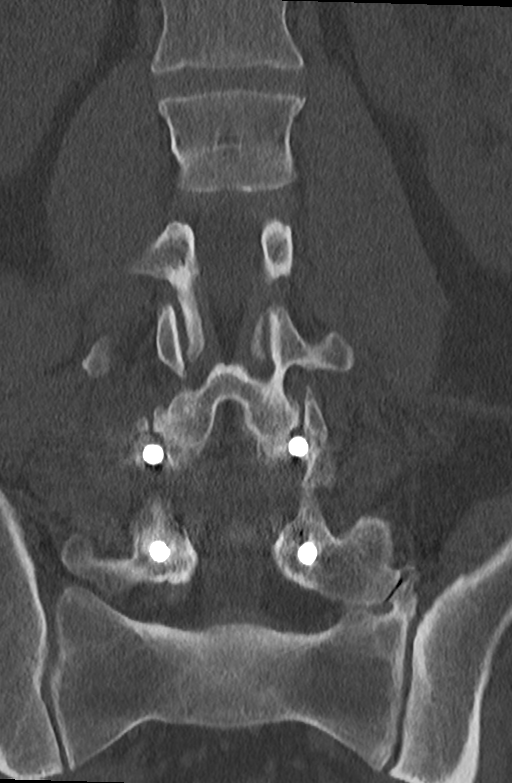

[11 of 33 positions shown; findings below may reference images not displayed]

FINDINGS: Segmentation: Hypoplastic right and absent left ribs at T12 which
results in normal lumbar segmentation which is the same numbering
system used previously.

Alignment: Stable lumbar lordosis and chronic grade 1
anterolisthesis of L4 on L5.

Vertebrae: No acute osseous abnormality identified. Postoperative
changes are described below. Intact visible sacrum and SI joints.

Paraspinal and other soft tissues: Mostly negative visible
noncontrast abdominal and pelvic viscera. Stable mild postoperative
changes to the paraspinal soft tissues.

However, in the left lower quadrant partially visible on series 3,
image 104 there seems to be inflammatory stranding associated with
what I suspect are diverticula of the distal descending or proximal
sigmoid colon.

Disc levels:

T12-L1:  Stable minimal disc bulge.

L1-L2:  Stable, negative.

L2-L3:  Stable minimal disc bulge and posterior element hypertrophy.

L3-L4: Stable mild disc bulging and posterior element hypertrophy,
most affecting the L3 neural foramina. Mild bilateral L3 foraminal
stenosis appears greater on the right and stable.

L4-L5: Previous decompression and fusion. Pedicle screws appear
stable and intact without loosening. Stable interbody implants. No
endplate subsidence. And there is evidence of some interbody
arthrodesis on coronal image 32. no convincing posterior element
arthrodesis. Although posterior element ossification has mildly
progressed on the left. No stenosis suspected.

L5-S1: Negative disc. Chronically degenerated left L5-S1
assimilation joint with vacuum phenomena appears stable on series 4,
image 91.
IMPRESSION: 1. Query left side pain, and if so consider Acute Diverticulitis of
the distal colon which may be partially visible on series 3, image
104. Follow-up CT Abdomen and Pelvis with oral and IV contrast would
best evaluate further.

2. Postoperative changes at L4-L5 with developing interbody
arthrodesis suspected and no adverse features.

3. Stable since 2573 adjacent L3-L4 and L5-S1 degeneration. Mild
bilateral L3 foraminal stenosis and chronically degenerated left
L5-S1 assimilation joint.

These results will be called to the ordering clinician or
representative by the Radiologist Assistant, and communication
documented in the PACS or zVision Dashboard.

## 2020-07-15 ENCOUNTER — Other Ambulatory Visit: Payer: Self-pay

## 2020-07-15 ENCOUNTER — Encounter: Payer: Self-pay | Admitting: Physical Therapy

## 2020-07-15 ENCOUNTER — Ambulatory Visit: Payer: BC Managed Care – PPO | Attending: Neurosurgery | Admitting: Physical Therapy

## 2020-07-15 DIAGNOSIS — M545 Low back pain, unspecified: Secondary | ICD-10-CM | POA: Diagnosis not present

## 2020-07-15 DIAGNOSIS — G8929 Other chronic pain: Secondary | ICD-10-CM | POA: Insufficient documentation

## 2020-07-15 DIAGNOSIS — M62830 Muscle spasm of back: Secondary | ICD-10-CM | POA: Insufficient documentation

## 2020-07-15 DIAGNOSIS — R29898 Other symptoms and signs involving the musculoskeletal system: Secondary | ICD-10-CM | POA: Insufficient documentation

## 2020-07-15 DIAGNOSIS — R262 Difficulty in walking, not elsewhere classified: Secondary | ICD-10-CM | POA: Diagnosis present

## 2020-07-15 NOTE — Therapy (Signed)
[facility] [place] [address]  [address] [place], [place], [address] Phone: [phone]   Fax:  [phone]  Physical Therapy Evaluation  Patient Details  Name: Albert Skinner MRN: [id number] Date of Birth: 26-Oct-1980 Referring Provider (PT): Donzetta SprungGary P. Wynetta Emeryram, MD   Encounter Date: 07/15/2020   PT End of Session - 07/15/20 1348    Visit Number 1    Number of Visits 12    Authorization Type BCBS    PT Start Time 1348    PT Stop Time 1447    PT Time Calculation (min) 59 min    Activity Tolerance Patient tolerated treatment well    Behavior During Therapy WFL for tasks assessed/performed           Past Medical History:  Diagnosis Date  . Anxiety   . Fatty liver   . Hypertension   . Sleep apnea    uses CPAP    Past Surgical History:  Procedure Laterality Date  . WISDOM TOOTH EXTRACTION      There were no vitals filed for this visit.    Subjective Assessment - 07/15/20 1351    Subjective Pt reports L4-5 fusion 3 yrs ago in Sept but now "L5 digging into S1". Pain started ~1 year ago when walking [place] his kids [place] [employer]. Was supposed to start PT in Feb but had a death in the family and other things came up. Notes occasional random "electrical shocks" slightly higher in lumbar spine. Also notes stiffness and soreness all the time - unable to get lasting relief from stretches.    Pertinent History L4-5 fusion - 06/10/17    Limitations Standing;Walking;House hold activities    How long can you stand comfortably? 2 minutes; better if rocking    How long can you walk comfortably? 10-15 minutes    Diagnostic tests CT lumbar 09/08/19: Postoperative changes at L4-L5 with developing interbody arthrodesis suspected and no adverse features. Stable since 2019 adjacent L3-L4 and L5-S1 degeneration. Mild bilateral L3 foraminal stenosis and chronically degenerated left L5-S1 assimilation joint.    Patient Stated Goals "to find some relief  or be ready to move on to the next step (surgery)"    Currently in Pain? Yes    Pain Score 1    up to 5-6/10 at worst   Pain Location Back    Pain Orientation Lower;Right    Pain Descriptors / Indicators Dull;Sore    Pain Type Chronic pain    Pain Radiating Towards n/a    Pain Onset Other (comment)   Oct 2020   Pain Frequency Constant   varies in intensity   Aggravating Factors  prolonged standing or walking    Pain Relieving Factors sitting down, ibuprofen, stretching, lidocaine patch    Effect of Pain on Daily Activities "stiff in the morning & sore in the evening"              [facility] - 07/15/20 1348      Assessment   Medical Diagnosis Lumbar spondylolithesis    Referring Provider (PT) Donzetta SprungGary P. Wynetta Emeryram, MD    Onset Date/Surgical Date --   Oct 2020   Hand Dominance Right    Next MD Visit TBD - after 6 PT visits vs 6 wks?    Prior Therapy tried PT prior to surgery w/o success & PT post-op      Precautions   Precautions None      Restrictions   Weight Bearing  Restrictions No      Balance Screen   Has the patient fallen in the past 6 months No    Has the patient had a decrease in activity level because of a fear of falling?  No    Is the patient reluctant to leave their home because of a fear of falling?  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    Leisure "tinkering", play with daughters, watch sports      Cognition   Overall Cognitive Status Within Functional Limits for tasks assessed      Observation/Other Assessments   Focus on Therapeutic Outcomes (FOTO)   Lumbar - 59% (41% limitation); Predicted 67% (33% limitation)      ROM / Strength   AROM / PROM / Strength AROM;Strength      AROM   AROM Assessment Site Lumbar    Lumbar Flexion fingertips to toes    Lumbar Extension 25% limited    Lumbar - Right Side Bend hand to mid gastroc    Lumbar - Left Side Bend hand to fibular head    Lumbar - Right Rotation WFL    Lumbar - Left Rotation [facility]      Strength   Strength Assessment Site Hip;Knee;Ankle    Right/Left Hip Right;Left    Right Hip Flexion 5/5    Right Hip Extension 4/5    Right Hip External Rotation  5/5    Right Hip Internal Rotation 5/5    Right Hip ABduction 4+/5    Right Hip ADduction 4+/5    Left Hip Flexion 5/5    Left Hip Extension 4+/5    Left Hip External Rotation 5/5    Left Hip Internal Rotation 5/5    Left Hip ABduction 4+/5    Left Hip ADduction 4+/5    Right/Left Knee Right;Left    Right Knee Flexion 5/5    Right Knee Extension 5/5    Left Knee Flexion 5/5    Left Knee Extension 5/5    Right/Left Ankle Right;Left    Right Ankle Dorsiflexion 4+/5    Right Ankle Plantar Flexion 5/5    Left Ankle Dorsiflexion 5/5    Left Ankle Plantar Flexion 5/5      Flexibility   Soft Tissue Assessment /Muscle Length yes    Hamstrings B mod tight    Quadriceps B mild tight    ITB B mild tight    Piriformis B mid/mod tight      Palpation   Palpation comment ttp over L5-S1 and R SIJ                      Objective measurements completed on examination: See above findings.       [facility] Adult PT Treatment/Exercise - 07/15/20 1348      Exercises   Exercises Lumbar      Lumbar Exercises: Stretches   Passive Hamstring Stretch Right;30 seconds;1 rep   pt to perform B at home   Passive Hamstring Stretch Limitations supine with strap  Single Knee to Chest Stretch Right;30 seconds;1 rep    Single Knee to Chest Stretch Limitations opp knee bent and straight - pt reporting minimal stretch    Lower Trunk  Rotation 30 seconds;2 reps    ITB Stretch Right;30 seconds;1 rep    ITB Stretch Limitations supine cross-body with strap - pt reporting minmal stretch    Piriformis Stretch Right;30 seconds;1 rep    Piriformis Stretch Limitations supine KTOS    Figure 4 Stretch 30 seconds;1 rep;Supine;With overpressure    Figure 4 Stretch Limitations pt reporting better stretch with KTOS    Other Lumbar Stretch Exercise R/L open book stretch 2 x 30 sec      Lumbar Exercises: Supine   Pelvic Tilt 5 reps;5 seconds                  PT Education - 07/15/20 1445    Education Details PT eval findings, anticipated POC & initial HEP    Person(s) Educated Patient    Methods Explanation;Demonstration;Verbal cues;Handout    Comprehension Verbalized understanding;Verbal cues required;Returned demonstration;Need further instruction            PT Short Term Goals - 07/15/20 1447      PT SHORT TERM GOAL #1   Title Patient will be independent with initial HEP    Status New    Target Date 07/29/20      PT SHORT TERM GOAL #2   Title Patient will verbalize/demonstrate understanding of neutral spine posture and proper body mechanics to reduce strain on lumbar spine    Status New    Target Date 08/05/20             PT Long Term Goals - 07/15/20 1447      PT LONG TERM GOAL #1   Title Patient will be independent with ongoing/advanced HEP    Status New    Target Date 08/26/20      PT LONG TERM GOAL #2   Title Patient to demonstrate appropriate posture and body mechanics needed for daily activities    Status New    Target Date 08/26/20      PT LONG TERM GOAL #3   Title Patient to demonstrate improved tissue quality and pliability as noted by reduced tissue tightness, tenderness to palpation and improved flexibility    Status New    Target Date 08/26/20      PT LONG TERM GOAL #4   Title Patient to report pain reduction in frequency and intensity by >/= 50%    Status New    Target Date  08/26/20      PT LONG TERM GOAL #5   Title Patient will improve standing and/or walking tolerance to >/= 30 minutes w/o pain interference to allow resumption of normal daily activities    Status New    Target Date 08/26/20                  Plan - 07/15/20 1447    Clinical Impression Statement Albert Skinner is a 39 y/o male who presents to OP PT for chronic LBP secondary to lumbar spondylolisthesis. He had L4-5 fusion 3 yrs ago and now is experiencing degeneration of L5-S1 - referred to PT as part of pre-operative conservative treatment. Current pain began in Oct 2020, but PT was delayed due to extenuating circumstances. Low back pain typically mild at rest but will increase up to 5-6/10 with prolonged standing or walking with pt also noting he is "stiff in the  morning and sore in the evening".  Deficits include pain/ttp at L5-S1 level and R SIJ, mildly limited lumbar AROM in extension with L lateral flexion decreased relative to R, mild to moderately limited proximal LE flexibility, as well as core and very mild proximal LE weakness. Pain limits tolerance for standing and walking limiting household tasks and community activity. Albert Skinner will benefit from skilled PT to address above deficits to improve flexibility and core strength to reduce LBP and allow for increased participation in desired activities with decreased pain interference.    Personal Factors and Comorbidities Comorbidity 3+;Time since onset of injury/illness/exacerbation;Past/Current Experience;Fitness    Comorbidities L4-5 fusion - 06/10/17, HTN, anxiety    Examination-Activity Limitations Stand;Locomotion Level;Caring for Others    Examination-Participation Restrictions Cleaning;Community Activity;Meal Prep;Occupation;Shop;Yard Work    Stability/Clinical Decision Making Stable/Uncomplicated    Clinical Decision Making Low    Rehab Potential Good    PT Frequency 2x / week    PT Duration 6 weeks    PT Treatment/Interventions ADLs/Self  Care Home Management;Cryotherapy;Electrical Stimulation;Iontophoresis 4mg /ml Dexamethasone;Moist Heat;Traction;Functional mobility training;Therapeutic activities;Therapeutic exercise;Neuromuscular re-education;Patient/family education;Manual techniques;Passive range of motion;Dry needling;Taping    PT Next Visit Plan Review initial HEP; posture and body mechancis education; lumbopelvic flexibilty and strengthening; manual therapy and modalites PRN    Consulted and Agree with Plan of Care Patient           Patient will benefit from skilled therapeutic intervention in order to improve the following deficits and impairments:  Decreased activity tolerance, Decreased endurance, Decreased mobility, Decreased range of motion, Decreased strength, Difficulty walking, Increased muscle spasms, Increased fascial restricitons, Impaired perceived functional ability, Impaired flexibility, Improper body mechanics, Postural dysfunction, Pain  Visit Diagnosis: Chronic bilateral low back pain without sciatica  Muscle spasm of back  Other symptoms and signs involving the musculoskeletal system  Difficulty in walking, not elsewhere classified     Problem List Patient Active Problem List   Diagnosis Date Noted  . Spondylolisthesis at L4-L5 level 06/10/2017    06/12/2017, PT, MPT 07/15/2020, 7:19 PM  [facility] [address]  [address] [place], [place], [place] Phone: [phone]   Fax:  [phone]  Name: Albert Skinner MRN: Albert Skinner Date of Birth: January 13, 1981

## 2020-07-15 NOTE — Patient Instructions (Signed)
    Home exercise program created by Judith Campillo, PT.  For questions, please contact Donnell Beauchamp via phone at [phone] or email at [email or web address]  [facility] [place] [address]  [address] [place], [place], [address] Phone: [phone]   Fax:  [phone]    

## 2020-07-25 ENCOUNTER — Other Ambulatory Visit: Payer: Self-pay

## 2020-07-25 ENCOUNTER — Ambulatory Visit: Payer: BC Managed Care – PPO

## 2020-07-25 DIAGNOSIS — R262 Difficulty in walking, not elsewhere classified: Secondary | ICD-10-CM

## 2020-07-25 DIAGNOSIS — M545 Low back pain, unspecified: Secondary | ICD-10-CM | POA: Diagnosis not present

## 2020-07-25 DIAGNOSIS — M62830 Muscle spasm of back: Secondary | ICD-10-CM

## 2020-07-25 DIAGNOSIS — R29898 Other symptoms and signs involving the musculoskeletal system: Secondary | ICD-10-CM

## 2020-07-25 DIAGNOSIS — G8929 Other chronic pain: Secondary | ICD-10-CM

## 2020-07-25 NOTE — Therapy (Signed)
[facility] [address]  [address] [place], [place], [address] Phone: [phone]   Fax:  [phone]  Physical Therapy Treatment  Patient Details  Name: Albert Skinner MRN: [id number] Date of Birth: 02/25/1981 Referring Provider (PT): Donzetta Sprung. Wynetta Emery, MD   Encounter Date: 07/25/2020   PT End of Session - 07/25/20 1415    Visit Number 2    Number of Visits 12    Authorization Type BCBS    PT Start Time 1410    PT Stop Time 1446    PT Time Calculation (min) 36 min    Activity Tolerance Patient tolerated treatment well    Behavior During Therapy WFL for tasks assessed/performed           Past Medical History:  Diagnosis Date  . Anxiety   . Fatty liver   . Hypertension   . Sleep apnea    uses CPAP    Past Surgical History:  Procedure Laterality Date  . WISDOM TOOTH EXTRACTION      There were no vitals filed for this visit.   Subjective Assessment - 07/25/20 1413    Subjective Pt. with complaint of primarily "stiffness" in back today.    Pertinent History L4-5 fusion - 06/10/17    Diagnostic tests CT lumbar 09/08/19: Postoperative changes at L4-L5 with developing interbody arthrodesis suspected and no adverse features. Stable since 2019 adjacent L3-L4 and L5-S1 degeneration. Mild bilateral L3 foraminal stenosis and chronically degenerated left L5-S1 assimilation joint.    Patient Stated Goals "to find some relief or be ready to move on to the next step (surgery)"    Currently in Pain? Yes    Pain Score 2     Pain Location Back    Pain Orientation Right;Lower;Left    Pain Descriptors / Indicators --   "Stiffness"   Pain Type Chronic pain    Pain Frequency Constant    Multiple Pain Sites No                             [facility] Adult PT Treatment/Exercise - 07/25/20 0001      Lumbar Exercises: Stretches   Passive Hamstring Stretch Right;Left;1 rep;30 seconds    Passive Hamstring Stretch  Limitations supine with strap    Lower Trunk Rotation 30 seconds;2 reps    Piriformis Stretch Right;Left;1 rep;30 seconds    Piriformis Stretch Limitations supine KTOS    Figure 4 Stretch 30 seconds    Figure 4 Stretch Limitations with strap assistance       Lumbar Exercises: Aerobic   Nustep Lvl 4, 6 min (LE/UE)      Lumbar Exercises: Supine   Bridge 10 reps;5 seconds    Bridge Limitations mid ROM       Lumbar Exercises: Sidelying   Other Sidelying Lumbar Exercises B "Open book" x 30 sec                     PT Short Term Goals - 07/25/20 1415      PT SHORT TERM GOAL #1   Title Patient will be independent with initial HEP    Status Achieved    Target Date 07/29/20      PT SHORT TERM GOAL #2   Title Patient will verbalize/demonstrate understanding of neutral spine posture and proper body mechanics to reduce strain on lumbar spine    Status On-going    Target  Date 08/05/20             PT Long Term Goals - 07/25/20 1416      PT LONG TERM GOAL #1   Title Patient will be independent with ongoing/advanced HEP    Status On-going      PT LONG TERM GOAL #2   Title Patient to demonstrate appropriate posture and body mechanics needed for daily activities    Status On-going      PT LONG TERM GOAL #3   Title Patient to demonstrate improved tissue quality and pliability as noted by reduced tissue tightness, tenderness to palpation and improved flexibility    Status On-going      PT LONG TERM GOAL #4   Title Patient to report pain reduction in frequency and intensity by >/= 50%    Status On-going      PT LONG TERM GOAL #5   Title Patient will improve standing and/or walking tolerance to >/= 30 minutes w/o pain interference to allow resumption of normal daily activities    Status On-going                 Plan - 07/25/20 1416    Clinical Impression Statement Terrelle arrived late to session thus treatment time limited.  reviewed HEP with minor cueing required  for proper technique with pelvic tilt HEP activity with good carryover/understanding.  Greysin pain free during session with complaint of more B lower back "stiffness" in mornings.  Will plan to discuss posture and body mechanics with daily activities in coming session.    Comorbidities L4-5 fusion - 06/10/17, HTN, anxiety    Rehab Potential Good    PT Frequency 2x / week    PT Treatment/Interventions ADLs/Self Care Home Management;Cryotherapy;Electrical Stimulation;Iontophoresis 4mg /ml Dexamethasone;Moist Heat;Traction;Functional mobility training;Therapeutic activities;Therapeutic exercise;Neuromuscular re-education;Patient/family education;Manual techniques;Passive range of motion;Dry needling;Taping    PT Next Visit Plan Posture and body mechancis education; lumbopelvic flexibilty and strengthening; manual therapy and modalites PRN    Consulted and Agree with Plan of Care Patient           Patient will benefit from skilled therapeutic intervention in order to improve the following deficits and impairments:  Decreased activity tolerance, Decreased endurance, Decreased mobility, Decreased range of motion, Decreased strength, Difficulty walking, Increased muscle spasms, Increased fascial restricitons, Impaired perceived functional ability, Impaired flexibility, Improper body mechanics, Postural dysfunction, Pain  Visit Diagnosis: Chronic bilateral low back pain without sciatica  Muscle spasm of back  Other symptoms and signs involving the musculoskeletal system  Difficulty in walking, not elsewhere classified     Problem List Patient Active Problem List   Diagnosis Date Noted  . Spondylolisthesis at L4-L5 level 06/10/2017    06/12/2017, PTA 07/25/20 3:08 PM   [facility] [address]  [address] [place], [place], [place] Phone: [phone]   Fax:  [phone]  Name: Albert Skinner MRN: Rosalie Gums Date of Birth:  January 25, 1981

## 2020-07-29 ENCOUNTER — Ambulatory Visit: Payer: BC Managed Care – PPO | Attending: Neurosurgery

## 2020-07-29 ENCOUNTER — Other Ambulatory Visit: Payer: Self-pay

## 2020-07-29 DIAGNOSIS — G8929 Other chronic pain: Secondary | ICD-10-CM | POA: Insufficient documentation

## 2020-07-29 DIAGNOSIS — M62830 Muscle spasm of back: Secondary | ICD-10-CM | POA: Diagnosis present

## 2020-07-29 DIAGNOSIS — M545 Low back pain, unspecified: Secondary | ICD-10-CM | POA: Insufficient documentation

## 2020-07-29 DIAGNOSIS — R29898 Other symptoms and signs involving the musculoskeletal system: Secondary | ICD-10-CM

## 2020-07-29 DIAGNOSIS — R262 Difficulty in walking, not elsewhere classified: Secondary | ICD-10-CM

## 2020-07-29 NOTE — Therapy (Signed)
[facility] [address]  [place], [place], [address] Phone: [phone]   Fax:  [phone]  Physical Therapy Treatment  Patient Details  Name: Albert Skinner MRN: [id number] Date of Birth: July 29, 1981 Referring Provider (PT): Julien Girt. Saintclair Halsted, MD   Encounter Date: 07/29/2020   PT End of Session - 07/29/20 1458    Visit Number 3    Number of Visits 12    Authorization Type BCBS    PT Start Time 1740   Pt. arrived late   PT Stop Time 1532    PT Time Calculation (min) 40 min    Activity Tolerance Patient tolerated treatment well    Behavior During Therapy [facility] for tasks assessed/performed           Past Medical History:  Diagnosis Date  . Anxiety   . Fatty liver   . Hypertension   . Sleep apnea    uses CPAP    Past Surgical History:  Procedure Laterality Date  . WISDOM TOOTH EXTRACTION      There were no vitals filed for this visit.   Subjective Assessment - 07/29/20 1455    Subjective Doing ok.  Had increased LBP after walking during halloween in neighborhood.    Pertinent History L4-5 fusion - 06/10/17    Diagnostic tests CT lumbar 09/08/19: Postoperative changes at L4-L5 with developing interbody arthrodesis suspected and no adverse features. Stable since 2019 adjacent L3-L4 and L5-S1 degeneration. Mild bilateral L3 foraminal stenosis and chronically degenerated left L5-S1 assimilation joint.    Patient Stated Goals "to find some relief or be ready to move on to the next step (surgery)"    Currently in Pain? No/denies    Pain Score 0-No pain    Pain Location Back                             [facility] Adult PT Treatment/Exercise - 07/29/20 0001      Self-Care   Self-Care Posture;Other Self-Care Comments    Posture education on benefit from neutral spinal posture with sitting and standing activities    Other Self-Care Comments  Instruction in proper body mechanics with daily tasks        Lumbar Exercises: Stretches   Passive Hamstring Stretch Right;Left;1 rep;30 seconds    Passive Hamstring Stretch Limitations supine with strap    Lower Trunk Rotation 30 seconds;2 reps    Figure 4 Stretch 30 seconds;3 reps    Figure 4 Stretch Limitations with strap assistance       Lumbar Exercises: Aerobic   Nustep Lvl 4, 6 min (LE/UE)      Lumbar Exercises: Supine   Bridge 15 reps;5 seconds    Bridge Limitations full ROM      Lumbar Exercises: Sidelying   Other Sidelying Lumbar Exercises B "Open book" 5" x 10                   PT Education - 07/29/20 1503    Education Details Posture and body mechanics handout; HEP update    Person(s) Educated Patient    Methods Explanation;Demonstration;Verbal cues;Handout    Comprehension Verbalized understanding;Returned demonstration;Verbal cues required            PT Short Term Goals - 07/29/20 1517      PT SHORT TERM GOAL #1   Title Patient will be independent with initial HEP    Status Achieved  Target Date 07/29/20      PT SHORT TERM GOAL #2   Title Patient will verbalize/demonstrate understanding of neutral spine posture and proper body mechanics to reduce strain on lumbar spine    Status Achieved    Target Date 08/05/20             PT Long Term Goals - 07/25/20 1416      PT LONG TERM GOAL #1   Title Patient will be independent with ongoing/advanced HEP    Status On-going      PT LONG TERM GOAL #2   Title Patient to demonstrate appropriate posture and body mechanics needed for daily activities    Status On-going      PT LONG TERM GOAL #3   Title Patient to demonstrate improved tissue quality and pliability as noted by reduced tissue tightness, tenderness to palpation and improved flexibility    Status On-going      PT LONG TERM GOAL #4   Title Patient to report pain reduction in frequency and intensity by >/= 50%    Status On-going      PT LONG TERM GOAL #5   Title Patient will improve standing  and/or walking tolerance to >/= 30 minutes w/o pain interference to allow resumption of normal daily activities    Status On-going                 Plan - 07/29/20 1458    Clinical Impression Statement Albert Skinner doing well.  Does note some progress with physical therapy noting somewhat reduced pain.  Does have pain leaning down to pick up things from floor.  Reviewed proper posture and body mechanics with daily activities to reduce lumbar strain with pt. verbalizing understanding.  STG #2 met.  Updated HEP with thoracic mobility activity "thread the needle" as pt. noting good stretch from this.    Comorbidities L4-5 fusion - 06/10/17, HTN, anxiety    Rehab Potential Good    PT Frequency 2x / week    PT Treatment/Interventions ADLs/Self Care Home Management;Cryotherapy;Electrical Stimulation;Iontophoresis 81m/ml Dexamethasone;Moist Heat;Traction;Functional mobility training;Therapeutic activities;Therapeutic exercise;Neuromuscular re-education;Patient/family education;Manual techniques;Passive range of motion;Dry needling;Taping    PT Next Visit Plan Posture and body mechancis education; lumbopelvic flexibilty and strengthening; manual therapy and modalites PRN    Consulted and Agree with Plan of Care Patient           Patient will benefit from skilled therapeutic intervention in order to improve the following deficits and impairments:  Decreased activity tolerance, Decreased endurance, Decreased mobility, Decreased range of motion, Decreased strength, Difficulty walking, Increased muscle spasms, Increased fascial restricitons, Impaired perceived functional ability, Impaired flexibility, Improper body mechanics, Postural dysfunction, Pain  Visit Diagnosis: Chronic bilateral low back pain without sciatica  Muscle spasm of back  Other symptoms and signs involving the musculoskeletal system  Difficulty in walking, not elsewhere classified     Problem List Patient Active Problem List    Diagnosis Date Noted  . Spondylolisthesis at L4-L5 level 06/10/2017    MBess Harvest PTA 07/29/20 5:32 PM   [facility] [address] [place] [address]Phone: [phone]  Fax:  [phone] Name: Albert DHAWANMRN: [id number] of Birth: [birth year]

## 2020-07-29 NOTE — Patient Instructions (Addendum)

## 2020-08-01 ENCOUNTER — Other Ambulatory Visit: Payer: Self-pay

## 2020-08-01 ENCOUNTER — Ambulatory Visit: Payer: BC Managed Care – PPO

## 2020-08-01 DIAGNOSIS — R262 Difficulty in walking, not elsewhere classified: Secondary | ICD-10-CM

## 2020-08-01 DIAGNOSIS — M545 Low back pain, unspecified: Secondary | ICD-10-CM | POA: Diagnosis not present

## 2020-08-01 DIAGNOSIS — G8929 Other chronic pain: Secondary | ICD-10-CM

## 2020-08-01 DIAGNOSIS — M62830 Muscle spasm of back: Secondary | ICD-10-CM

## 2020-08-01 DIAGNOSIS — R29898 Other symptoms and signs involving the musculoskeletal system: Secondary | ICD-10-CM

## 2020-08-01 NOTE — Therapy (Signed)
[facility] [address]  [address] [place], [place], [address] Phone: [phone]   Fax:  [phone]  Physical Therapy Treatment  Patient Details  Name: Albert Skinner MRN: [id number] Date of Birth: 02-26-1981 Referring Provider (PT): Donzetta Sprung. Wynetta Emery, MD   Encounter Date: 08/01/2020   PT End of Session - 08/01/20 1408    Visit Number 4    Number of Visits 12    Authorization Type BCBS    PT Start Time 1403    PT Stop Time 1441    PT Time Calculation (min) 38 min    Activity Tolerance Patient tolerated treatment well    Behavior During Therapy WFL for tasks assessed/performed           Past Medical History:  Diagnosis Date  . Anxiety   . Fatty liver   . Hypertension   . Sleep apnea    uses CPAP    Past Surgical History:  Procedure Laterality Date  . WISDOM TOOTH EXTRACTION      There were no vitals filed for this visit.   Subjective Assessment - 08/01/20 1406    Subjective Had intense mid back pain when standing back up from putting down dog bowl.    Pertinent History L4-5 fusion - 06/10/17    Diagnostic tests CT lumbar 09/08/19: Postoperative changes at L4-L5 with developing interbody arthrodesis suspected and no adverse features. Stable since 2019 adjacent L3-L4 and L5-S1 degeneration. Mild bilateral L3 foraminal stenosis and chronically degenerated left L5-S1 assimilation joint.    Patient Stated Goals "to find some relief or be ready to move on to the next step (surgery)"    Currently in Pain? Yes    Pain Score 5     Pain Location Back    Pain Orientation Lower;Right;Left    Pain Descriptors / Indicators Sharp    Pain Type Chronic pain    Pain Frequency Intermittent    Multiple Pain Sites No                             [facility] Adult PT Treatment/Exercise - 08/01/20 0001      Lumbar Exercises: Stretches   Passive Hamstring Stretch Right;Left;1 rep;30 seconds    Passive Hamstring  Stretch Limitations supine with strap    ITB Stretch Right;30 seconds;1 rep    ITB Stretch Limitations supine cross-body with strap - pt reporting minmal stretch      Lumbar Exercises: Aerobic   Nustep Lvl 4, 6 min (LE/UE)      Lumbar Exercises: Machines for Strengthening   Other Lumbar Machine Exercise Standing lats/row 20# x 15     Other Lumbar Machine Exercise standing cable row 20# x 15 reps       Lumbar Exercises: Sidelying   Other Sidelying Lumbar Exercises B "Open book" 5" x 10       Lumbar Exercises: Quadruped   Single Arm Raise Right;Left;10 reps;3 seconds    Single Arm Raises Limitations cues for neutral position     Straight Leg Raise 10 reps;3 seconds    Straight Leg Raises Limitations cues for neutral spine                   PT Education - 08/01/20 1525    Education Details HEP update; prone childs pose    Person(s) Educated Patient    Methods Demonstration;Explanation;Verbal cues;Handout    Comprehension Verbalized understanding;Returned demonstration;Verbal  cues required            PT Short Term Goals - 07/29/20 1517      PT SHORT TERM GOAL #1   Title Patient will be independent with initial HEP    Status Achieved    Target Date 07/29/20      PT SHORT TERM GOAL #2   Title Patient will verbalize/demonstrate understanding of neutral spine posture and proper body mechanics to reduce strain on lumbar spine    Status Achieved    Target Date 08/05/20             PT Long Term Goals - 07/25/20 1416      PT LONG TERM GOAL #1   Title Patient will be independent with ongoing/advanced HEP    Status On-going      PT LONG TERM GOAL #2   Title Patient to demonstrate appropriate posture and body mechanics needed for daily activities    Status On-going      PT LONG TERM GOAL #3   Title Patient to demonstrate improved tissue quality and pliability as noted by reduced tissue tightness, tenderness to palpation and improved flexibility    Status On-going       PT LONG TERM GOAL #4   Title Patient to report pain reduction in frequency and intensity by >/= 50%    Status On-going      PT LONG TERM GOAL #5   Title Patient will improve standing and/or walking tolerance to >/= 30 minutes w/o pain interference to allow resumption of normal daily activities    Status On-going                 Plan - 08/01/20 1408    Clinical Impression Statement Pt. reporting he has been trying to incorporate proper body mechanics into his daily activities while cleaning out his garage over this past few days.  Does verbalize improved awareness of his posture.  Progressing toward LTG #2.  Pt. noting 10-15% improvement in pain since starting therapy.  Progressing toward LTG #4.  Pt. reporting he is able to walk/stand for 15 min before pain limits him.  LTG #5 partially achieved.  Progressed thoracolumbar ROM and strengthening activities without issue today.  Updated HEP (see pt. education).    Comorbidities L4-5 fusion - 06/10/17, HTN, anxiety    Rehab Potential Good    PT Frequency 2x / week    PT Duration 6 weeks    PT Treatment/Interventions ADLs/Self Care Home Management;Cryotherapy;Electrical Stimulation;Iontophoresis 4mg /ml Dexamethasone;Moist Heat;Traction;Functional mobility training;Therapeutic activities;Therapeutic exercise;Neuromuscular re-education;Patient/family education;Manual techniques;Passive range of motion;Dry needling;Taping    PT Next Visit Plan Posture and body mechancis education; lumbopelvic flexibilty and strengthening; manual therapy and modalites PRN    Consulted and Agree with Plan of Care Patient           Patient will benefit from skilled therapeutic intervention in order to improve the following deficits and impairments:  Decreased activity tolerance, Decreased endurance, Decreased mobility, Decreased range of motion, Decreased strength, Difficulty walking, Increased muscle spasms, Increased fascial restricitons, Impaired  perceived functional ability, Impaired flexibility, Improper body mechanics, Postural dysfunction, Pain  Visit Diagnosis: Chronic bilateral low back pain without sciatica  Muscle spasm of back  Other symptoms and signs involving the musculoskeletal system  Difficulty in walking, not elsewhere classified     Problem List Patient Active Problem List   Diagnosis Date Noted  . Spondylolisthesis at L4-L5 level 06/10/2017    06/12/2017, PTA 08/01/20 3:48 PM  [facility] [address]  [address] [place], [place], [address] Phone: [phone]   Fax:  [phone]  Name: Albert Skinner MRN: [id number] Date of Birth: 1981/01/05

## 2020-08-05 ENCOUNTER — Ambulatory Visit: Payer: BC Managed Care – PPO

## 2020-08-08 ENCOUNTER — Other Ambulatory Visit: Payer: Self-pay

## 2020-08-08 ENCOUNTER — Encounter: Payer: Self-pay | Admitting: Physical Therapy

## 2020-08-08 ENCOUNTER — Ambulatory Visit: Payer: BC Managed Care – PPO | Admitting: Physical Therapy

## 2020-08-08 DIAGNOSIS — M62830 Muscle spasm of back: Secondary | ICD-10-CM

## 2020-08-08 DIAGNOSIS — R29898 Other symptoms and signs involving the musculoskeletal system: Secondary | ICD-10-CM

## 2020-08-08 DIAGNOSIS — G8929 Other chronic pain: Secondary | ICD-10-CM

## 2020-08-08 DIAGNOSIS — M545 Low back pain, unspecified: Secondary | ICD-10-CM | POA: Diagnosis not present

## 2020-08-08 DIAGNOSIS — R262 Difficulty in walking, not elsewhere classified: Secondary | ICD-10-CM

## 2020-08-08 NOTE — Therapy (Signed)
[facility] [address]  [address] [place], [place], [address] Phone: [phone]   Fax:  [phone]  Physical Therapy Treatment  Patient Details  Name: Albert Skinner MRN: [id number] Date of Birth: November 11, 1980 Referring Provider (PT): Donzetta Sprung. Wynetta Emery, MD   Encounter Date: 08/08/2020   PT End of Session - 08/08/20 1402    Visit Number 5    Number of Visits 12    Authorization Type BCBS    PT Start Time 1402    PT Stop Time 1447    PT Time Calculation (min) 45 min    Activity Tolerance Patient tolerated treatment well    Behavior During Therapy WFL for tasks assessed/performed           Past Medical History:  Diagnosis Date  . Anxiety   . Fatty liver   . Hypertension   . Sleep apnea    uses CPAP    Past Surgical History:  Procedure Laterality Date  . WISDOM TOOTH EXTRACTION      There were no vitals filed for this visit.   Subjective Assessment - 08/08/20 1405    Subjective Pt reports overall his back has been better - less dull achy pain but still get random "shots" of pain all in the same spot.    Pertinent History L4-5 fusion - 06/10/17    Diagnostic tests CT lumbar 09/08/19: Postoperative changes at L4-L5 with developing interbody arthrodesis suspected and no adverse features. Stable since 2019 adjacent L3-L4 and L5-S1 degeneration. Mild bilateral L3 foraminal stenosis and chronically degenerated left L5-S1 assimilation joint.    Patient Stated Goals "to find some relief or be ready to move on to the next step (surgery)"    Currently in Pain? Yes    Pain Score 1    0.5/10   Pain Location Back    Pain Orientation Lower    Pain Descriptors / Indicators Discomfort    Pain Type Chronic pain    Pain Frequency Intermittent                             [facility] Adult PT Treatment/Exercise - 08/08/20 1402      Exercises   Exercises Lumbar      Lumbar Exercises: Aerobic   Nustep L5 x 6 min  (LE/UE)      Lumbar Exercises: Standing   Heel Raises 15 reps;5 seconds    Heel Raises Limitations cues for abd bracing + quad/glute sets with heel lift    Functional Squats 15 reps;5 seconds    Functional Squats Limitations supported squat at [employer] Both;10 reps;Strengthening;Theraband    Theraband Level (Row) Level 3 (Green)    Row Limitations cues for abd bracing & scap retraction, keeping forearms parallel to floor & avoiding upward rotation of elbows    Shoulder Extension Both;10 reps;Strengthening;Theraband    Theraband Level (Shoulder Extension) Level 3 (Green)    Shoulder Extension Limitations cues for abd bracing & scap retraction,    Other Standing Lumbar Exercises R/L green TB pallof press 10 x 3"      Lumbar Exercises: Supine   Dead Bug 5 reps;10 reps;3 seconds    Dead Bug Limitations 1st 5 reps from hooklying; last 10 reps from 90/90 hip/knee    Bridge 10 reps;5 seconds    Bridge Limitations + green TB hip ABD isometric       Lumbar Exercises:  Sidelying   Clam Right;Left;10 reps;3 seconds    Clam Limitations green TB      Lumbar Exercises: Quadruped   Single Arm Raise Right;Left;10 reps;3 seconds    Straight Leg Raise 10 reps;3 seconds    Straight Leg Raises Limitations cues for neutral spine     Opposite Arm/Leg Raise Right arm/Left leg;Left arm/Right leg;10 reps;3 seconds    Opposite Arm/Leg Raise Limitations cues for neutral spine    pillow under knees d/t tenderness from L Osgood-Schlatter                 PT Education - 08/08/20 1447    Education Details HEP update - lumbopelvic strengthening    Person(s) Educated Patient    Methods Explanation;Demonstration;Verbal cues;Tactile cues;Handout    Comprehension Verbalized understanding;Verbal cues required;Tactile cues required;Returned demonstration;Need further instruction            PT Short Term Goals - 08/08/20 1407      PT SHORT TERM GOAL #1   Title Patient will be independent with  initial HEP    Status Achieved   07/25/20     PT SHORT TERM GOAL #2   Title Patient will verbalize/demonstrate understanding of neutral spine posture and proper body mechanics to reduce strain on lumbar spine    Status Achieved   07/29/20            PT Long Term Goals - 08/08/20 1407      PT LONG TERM GOAL #1   Title Patient will be independent with ongoing/advanced HEP    Status On-going    Target Date 08/26/20      PT LONG TERM GOAL #2   Title Patient to demonstrate appropriate posture and body mechanics needed for daily activities    Status On-going    Target Date 08/26/20      PT LONG TERM GOAL #3   Title Patient to demonstrate improved tissue quality and pliability as noted by reduced tissue tightness, tenderness to palpation and improved flexibility    Status On-going    Target Date 08/26/20      PT LONG TERM GOAL #4   Title Patient to report pain reduction in frequency and intensity by >/= 50%    Status On-going    Target Date 08/26/20      PT LONG TERM GOAL #5   Title Patient will improve standing and/or walking tolerance to >/= 30 minutes w/o pain interference to allow resumption of normal daily activities    Status On-going    Target Date 08/26/20                 Plan - 08/08/20 1408    Clinical Impression Statement Fillmore reports his general LBP is improving but will still experience brief sharp pains localized near L SIJ, although SIJ alignment and mobility appears WNL. He reports HEP stretches have been helping and denies need for review or update of stretches, therefore proceeded with progression of lumbopelvic strengthening and stabilization. Good tolerance for most exercises other than sensitivity with weight on knees in quadruped d/t h/o Osgood-Schlatter disease. HEP updated to include some of strengthening exercises.    Comorbidities L4-5 fusion - 06/10/17, HTN, anxiety    Rehab Potential Good    PT Frequency 2x / week    PT Duration 6 weeks    PT  Treatment/Interventions ADLs/Self Care Home Management;Cryotherapy;Electrical Stimulation;Iontophoresis 4mg /ml Dexamethasone;Moist Heat;Traction;Functional mobility training;Therapeutic activities;Therapeutic exercise;Neuromuscular re-education;Patient/family education;Manual techniques;Passive range of motion;Dry needling;Taping    PT  Next Visit Plan assess response to strengthening progression of HEP and review PRN, lumbopelvic flexibilty and strengthening; manual therapy and modalites PRN; review posture and body mechancis education PRN    Consulted and Agree with Plan of Care Patient           Patient will benefit from skilled therapeutic intervention in order to improve the following deficits and impairments:  Decreased activity tolerance, Decreased endurance, Decreased mobility, Decreased range of motion, Decreased strength, Difficulty walking, Increased muscle spasms, Increased fascial restricitons, Impaired perceived functional ability, Impaired flexibility, Improper body mechanics, Postural dysfunction, Pain  Visit Diagnosis: Chronic bilateral low back pain without sciatica  Muscle spasm of back  Other symptoms and signs involving the musculoskeletal system  Difficulty in walking, not elsewhere classified     Problem List Patient Active Problem List   Diagnosis Date Noted  . Spondylolisthesis at L4-L5 level 06/10/2017    Marry Guan, PT, MPT 08/08/2020, 7:06 PM  [facility] [address]  [address] [place], [place], [address] Phone: [phone]   Fax:  [phone]  Name: ALBARO DEVINEY MRN: [id number] Date of Birth: 1981-01-11

## 2020-08-08 NOTE — Patient Instructions (Signed)
    Home exercise program created by Caedyn Tassinari, PT.  For questions, please contact Sharbel Sahagun via phone at [phone] or email at [email or web address]   [facility] [place] [address]  [address] [place], [place], [address] Phone: [phone]   Fax:  [phone]    

## 2020-08-12 ENCOUNTER — Ambulatory Visit: Payer: BC Managed Care – PPO

## 2020-08-12 ENCOUNTER — Other Ambulatory Visit: Payer: Self-pay

## 2020-08-12 DIAGNOSIS — M62830 Muscle spasm of back: Secondary | ICD-10-CM

## 2020-08-12 DIAGNOSIS — R29898 Other symptoms and signs involving the musculoskeletal system: Secondary | ICD-10-CM

## 2020-08-12 DIAGNOSIS — R262 Difficulty in walking, not elsewhere classified: Secondary | ICD-10-CM

## 2020-08-12 DIAGNOSIS — M545 Low back pain, unspecified: Secondary | ICD-10-CM | POA: Diagnosis not present

## 2020-08-12 DIAGNOSIS — G8929 Other chronic pain: Secondary | ICD-10-CM

## 2020-08-12 NOTE — Therapy (Signed)
[facility] [address]  [address] [place], [place], [address] Phone: [phone]   Fax:  [phone]  Physical Therapy Treatment  Patient Details  Name: Albert Skinner MRN: [id number] Date of Birth: 11-03-1980 Referring Provider (PT): Donzetta Sprung. Wynetta Emery, MD   Encounter Date: 08/12/2020   PT End of Session - 08/12/20 1407    Visit Number 6    Number of Visits 12    Authorization Type BCBS    PT Start Time 1403    PT Stop Time 1441    PT Time Calculation (min) 38 min    Activity Tolerance Patient tolerated treatment well    Behavior During Therapy WFL for tasks assessed/performed           Past Medical History:  Diagnosis Date  . Anxiety   . Fatty liver   . Hypertension   . Sleep apnea    uses CPAP    Past Surgical History:  Procedure Laterality Date  . WISDOM TOOTH EXTRACTION      There were no vitals filed for this visit.   Subjective Assessment - 08/12/20 1406    Subjective Pt. with no new complaints.  Pt. reports "life got in the way" and was unable to perform HEP.    Pertinent History L4-5 fusion - 06/10/17    Diagnostic tests CT lumbar 09/08/19: Postoperative changes at L4-L5 with developing interbody arthrodesis suspected and no adverse features. Stable since 2019 adjacent L3-L4 and L5-S1 degeneration. Mild bilateral L3 foraminal stenosis and chronically degenerated left L5-S1 assimilation joint.    Patient Stated Goals "to find some relief or be ready to move on to the next step (surgery)"    Currently in Pain? Yes    Pain Score 1     Pain Location Back    Pain Orientation Lower    Pain Descriptors / Indicators Dull    Pain Type Chronic pain    Multiple Pain Sites No                             [facility] Adult PT Treatment/Exercise - 08/12/20 0001      Lumbar Exercises: Stretches   Prone on Elbows Stretch 1 rep;30 seconds    Prone on Elbows Stretch Limitations pain after 30 sec thus  terminated       Lumbar Exercises: Aerobic   Nustep L5 x 6 min (LE/UE)      Lumbar Exercises: Machines for Strengthening   Other Lumbar Machine Exercise Standing lats/row 25# x 15       Lumbar Exercises: Standing   Other Standing Lumbar Exercises R/L green TB pallof press 10 x 3"   cues for proper motion      Lumbar Exercises: Supine   Dead Bug 10 reps;3 seconds    Dead Bug Limitations 90/90 position    Bridge 15 reps      Lumbar Exercises: Sidelying   Other Sidelying Lumbar Exercises B "Open book" 5" x 10       Manual Therapy   Manual Therapy Taping    Kinesiotex Create Space      Kinesiotix   Create Space L SIJ "star" taping pattern (30% stretch all strips)   K-taping                    PT Short Term Goals - 08/08/20 1407      PT SHORT TERM GOAL #1  Title Patient will be independent with initial HEP    Status Achieved   07/25/20     PT SHORT TERM GOAL #2   Title Patient will verbalize/demonstrate understanding of neutral spine posture and proper body mechanics to reduce strain on lumbar spine    Status Achieved   07/29/20            PT Long Term Goals - 08/08/20 1407      PT LONG TERM GOAL #1   Title Patient will be independent with ongoing/advanced HEP    Status On-going    Target Date 08/26/20      PT LONG TERM GOAL #2   Title Patient to demonstrate appropriate posture and body mechanics needed for daily activities    Status On-going    Target Date 08/26/20      PT LONG TERM GOAL #3   Title Patient to demonstrate improved tissue quality and pliability as noted by reduced tissue tightness, tenderness to palpation and improved flexibility    Status On-going    Target Date 08/26/20      PT LONG TERM GOAL #4   Title Patient to report pain reduction in frequency and intensity by >/= 50%    Status On-going    Target Date 08/26/20      PT LONG TERM GOAL #5   Title Patient will improve standing and/or walking tolerance to >/= 30 minutes w/o pain  interference to allow resumption of normal daily activities    Status On-going    Target Date 08/26/20                 Plan - 08/12/20 1414    Clinical Impression Statement Albert Skinner noting continued occasional shots of pain at L SIJ area throughout day despite overall LBP improvement with therapy.  Trialed K-taping "star pattern" over L SIJ for hopeful improvement in pain at this area.  Progressed lumbopelvic strengthening and ROM activities without issue today.  Pt. continues to exert good effort in session.    Comorbidities L4-5 fusion - 06/10/17, HTN, anxiety    Rehab Potential Good    PT Frequency 2x / week    PT Duration 6 weeks    PT Treatment/Interventions ADLs/Self Care Home Management;Cryotherapy;Electrical Stimulation;Iontophoresis 4mg /ml Dexamethasone;Moist Heat;Traction;Functional mobility training;Therapeutic activities;Therapeutic exercise;Neuromuscular re-education;Patient/family education;Manual techniques;Passive range of motion;Dry needling;Taping    PT Next Visit Plan Lumbopelvic flexibilty and strengthening; manual therapy and modalites PRN; review posture and body mechancis education PRN    Consulted and Agree with Plan of Care Patient           Patient will benefit from skilled therapeutic intervention in order to improve the following deficits and impairments:  Decreased activity tolerance, Decreased endurance, Decreased mobility, Decreased range of motion, Decreased strength, Difficulty walking, Increased muscle spasms, Increased fascial restricitons, Impaired perceived functional ability, Impaired flexibility, Improper body mechanics, Postural dysfunction, Pain  Visit Diagnosis: Chronic bilateral low back pain without sciatica  Muscle spasm of back  Other symptoms and signs involving the musculoskeletal system  Difficulty in walking, not elsewhere classified     Problem List Patient Active Problem List   Diagnosis Date Noted  . Spondylolisthesis at  L4-L5 level 06/10/2017    06/12/2017, PTA 08/12/20 6:21 PM   [facility] [address]  [address] [place], [place], [place] Phone: [phone]   Fax:  [phone]  Name: Albert Skinner MRN: Rosalie Gums Date of Birth: 02-23-1981

## 2020-08-15 ENCOUNTER — Other Ambulatory Visit: Payer: Self-pay

## 2020-08-15 ENCOUNTER — Ambulatory Visit: Payer: BC Managed Care – PPO | Admitting: Physical Therapy

## 2020-08-15 ENCOUNTER — Encounter: Payer: Self-pay | Admitting: Physical Therapy

## 2020-08-15 DIAGNOSIS — R262 Difficulty in walking, not elsewhere classified: Secondary | ICD-10-CM

## 2020-08-15 DIAGNOSIS — M545 Low back pain, unspecified: Secondary | ICD-10-CM

## 2020-08-15 DIAGNOSIS — R29898 Other symptoms and signs involving the musculoskeletal system: Secondary | ICD-10-CM

## 2020-08-15 DIAGNOSIS — G8929 Other chronic pain: Secondary | ICD-10-CM

## 2020-08-15 DIAGNOSIS — M62830 Muscle spasm of back: Secondary | ICD-10-CM

## 2020-08-15 NOTE — Therapy (Signed)
[facility] [address]  [address] [place], [place], [address] Phone: [phone]   Fax:  [phone]  Physical Therapy Treatment  Patient Details  Name: Albert Skinner MRN: [id number] Date of Birth: 1980-10-03 Referring Provider (PT): Donzetta Sprung. Wynetta Emery, MD   Encounter Date: 08/15/2020   PT End of Session - 08/15/20 1415    Visit Number 7    Number of Visits 12    Date for PT Re-Evaluation 08/26/20    Authorization Type BCBS    PT Start Time 1415   Pt arrived late from job interview   PT Stop Time 1447    PT Time Calculation (min) 32 min    Activity Tolerance Patient tolerated treatment well    Behavior During Therapy WFL for tasks assessed/performed           Past Medical History:  Diagnosis Date  . Anxiety   . Fatty liver   . Hypertension   . Sleep apnea    uses CPAP    Past Surgical History:  Procedure Laterality Date  . WISDOM TOOTH EXTRACTION      There were no vitals filed for this visit.   Subjective Assessment - 08/15/20 1417    Subjective Pt reports no problems with latest HEP update. Pt notes back pain has been better with stretches and exercises but still notes increased pain with fatigue to the point where he has to go sit down. Reports deep dull pain in R flank earlier today that lasted for ~5 minutes but then went away.    Pertinent History L4-5 fusion - 06/10/17    How long can you stand comfortably? 45 minutes    Diagnostic tests CT lumbar 09/08/19: Postoperative changes at L4-L5 with developing interbody arthrodesis suspected and no adverse features. Stable since 2019 adjacent L3-L4 and L5-S1 degeneration. Mild bilateral L3 foraminal stenosis and chronically degenerated left L5-S1 assimilation joint.    Patient Stated Goals "to find some relief or be ready to move on to the next step (surgery)"    Currently in Pain? No/denies                             [facility] Adult PT  Treatment/Exercise - 08/15/20 1415      Exercises   Exercises Lumbar      Lumbar Exercises: Aerobic   Nustep L6 x 6 min (LE/UE)      Lumbar Exercises: Machines for Strengthening   Other Lumbar Machine Exercise Cable R/L pallof press 10# x 15      Lumbar Exercises: Standing   Functional Squats 10 reps;5 seconds   2 sets   Functional Squats Limitations TRX squat; 2nd set + triple extension    Forward Lunge 10 reps;3 seconds    Forward Lunge Limitations TRX    Wall Slides 10 reps;5 seconds    Wall Slides Limitations on green Pball    Row Both;15 reps;Strengthening    Row Limitations TRX row      Lumbar Exercises: Seated   Long Arc Quad on [place] Both;10 reps    LAQ on [place] Limitations green Pball    Hip Flexion on Ball Right;Left;10 reps   2 sets   Hip Flexion on Ball Limitations green Pball; 2nd set + alt UE    Other Seated Lumbar Exercises Green Pball walk-out into bridge positon x 10      Lumbar Exercises: Quadruped   Opposite  Arm/Leg Raise Right arm/Left leg;Left arm/Right leg;10 reps;3 seconds    Opposite Arm/Leg Raise Limitations plank position over green Pball                    PT Short Term Goals - 08/08/20 1407      PT SHORT TERM GOAL #1   Title Patient will be independent with initial HEP    Status Achieved   07/25/20     PT SHORT TERM GOAL #2   Title Patient will verbalize/demonstrate understanding of neutral spine posture and proper body mechanics to reduce strain on lumbar spine    Status Achieved   07/29/20            PT Long Term Goals - 08/08/20 1407      PT LONG TERM GOAL #1   Title Patient will be independent with ongoing/advanced HEP    Status On-going    Target Date 08/26/20      PT LONG TERM GOAL #2   Title Patient to demonstrate appropriate posture and body mechanics needed for daily activities    Status On-going    Target Date 08/26/20      PT LONG TERM GOAL #3   Title Patient to demonstrate improved tissue quality and  pliability as noted by reduced tissue tightness, tenderness to palpation and improved flexibility    Status On-going    Target Date 08/26/20      PT LONG TERM GOAL #4   Title Patient to report pain reduction in frequency and intensity by >/= 50%    Status On-going    Target Date 08/26/20      PT LONG TERM GOAL #5   Title Patient will improve standing and/or walking tolerance to >/= 30 minutes w/o pain interference to allow resumption of normal daily activities    Status On-going    Target Date 08/26/20                 Plan - 08/15/20 1447    Clinical Impression Statement Albert Skinner reports no issues with any of HEP including most recent addition of strengthening program and denies need for review. He notes improvement in his back pain with performance of stretches and strength exercise allowing for increased standing/activity tolerance but continues to experience increased pain with fatigue requiring him to eventually sit down after ~45 minutes. Increased emphasis on body weight strengthening to improve tolerance for upright activities with no increased pain reported and pt denying need to stop for stretch breaks. Albert Skinner is nearing the end of his certification period but has 5 approved visits remaining in his POC - he would like to consider extending the cert period to allow completion of all remaining visits as he feels more accountable to keep up with exercises when coming to PT.    Comorbidities L4-5 fusion - 06/10/17, HTN, anxiety    Rehab Potential Good    PT Frequency 2x / week    PT Duration 6 weeks    PT Treatment/Interventions ADLs/Self Care Home Management;Cryotherapy;Electrical Stimulation;Iontophoresis 4mg /ml Dexamethasone;Moist Heat;Traction;Functional mobility training;Therapeutic activities;Therapeutic exercise;Neuromuscular re-education;Patient/family education;Manual techniques;Passive range of motion;Dry needling;Taping    PT Next Visit Plan Lumbopelvic flexibilty and  strengthening; manual therapy and modalites PRN; review posture and body mechancis education PRN    Consulted and Agree with Plan of Care Patient           Patient will benefit from skilled therapeutic intervention in order to improve the following deficits and impairments:  Decreased activity  tolerance, Decreased endurance, Decreased mobility, Decreased range of motion, Decreased strength, Difficulty walking, Increased muscle spasms, Increased fascial restricitons, Impaired perceived functional ability, Impaired flexibility, Improper body mechanics, Postural dysfunction, Pain  Visit Diagnosis: Chronic bilateral low back pain without sciatica  Muscle spasm of back  Other symptoms and signs involving the musculoskeletal system  Difficulty in walking, not elsewhere classified     Problem List Patient Active Problem List   Diagnosis Date Noted  . Spondylolisthesis at L4-L5 level 06/10/2017    Marry Guan, PT, MPT 08/15/2020, 3:02 PM  [facility] [address]  [address] [place], [place], [address] Phone: [phone]   Fax:  [phone]  Name: Albert Skinner MRN: [id number] Date of Birth: October 17, 1980

## 2020-08-20 ENCOUNTER — Encounter: Payer: BC Managed Care – PPO | Admitting: Physical Therapy

## 2020-08-26 ENCOUNTER — Ambulatory Visit: Payer: BC Managed Care – PPO | Admitting: Physical Therapy

## 2020-09-05 ENCOUNTER — Encounter: Payer: Self-pay | Admitting: Physical Therapy

## 2020-09-05 ENCOUNTER — Other Ambulatory Visit: Payer: Self-pay

## 2020-09-05 ENCOUNTER — Ambulatory Visit: Payer: BC Managed Care – PPO | Attending: Neurosurgery | Admitting: Physical Therapy

## 2020-09-05 DIAGNOSIS — M545 Low back pain, unspecified: Secondary | ICD-10-CM | POA: Diagnosis not present

## 2020-09-05 DIAGNOSIS — R29898 Other symptoms and signs involving the musculoskeletal system: Secondary | ICD-10-CM | POA: Diagnosis present

## 2020-09-05 DIAGNOSIS — G8929 Other chronic pain: Secondary | ICD-10-CM | POA: Insufficient documentation

## 2020-09-05 DIAGNOSIS — R262 Difficulty in walking, not elsewhere classified: Secondary | ICD-10-CM | POA: Diagnosis present

## 2020-09-05 DIAGNOSIS — M62830 Muscle spasm of back: Secondary | ICD-10-CM | POA: Diagnosis present

## 2020-09-05 NOTE — Patient Instructions (Signed)

## 2020-09-05 NOTE — Therapy (Signed)
[facility] [address]  [place], [place], [address] Phone: [phone]   Fax:  [phone]  Physical Therapy Treatment / Recert  Patient Details  Name: Albert Skinner MRN: [id number] Date of Birth: 11/19/1980 Referring Provider (PT): Julien Girt. Saintclair Halsted, MD  Progress Note  Reporting Period 07/15/2020 to 09/05/2020  See note below for Objective Data and Assessment of Progress/Goals.      Encounter Date: 09/05/2020   PT End of Session - 09/05/20 1108    Visit Number 8    Number of Visits 12    Date for PT Re-Evaluation 10/03/20    Authorization Type BCBS    PT Start Time 1108    PT Stop Time 1150    PT Time Calculation (min) 42 min    Activity Tolerance Patient tolerated treatment well    Behavior During Therapy WFL for tasks assessed/performed           Past Medical History:  Diagnosis Date  . Anxiety   . Fatty liver   . Hypertension   . Sleep apnea    uses CPAP    Past Surgical History:  Procedure Laterality Date  . WISDOM TOOTH EXTRACTION      There were no vitals filed for this visit.   Subjective Assessment - 09/05/20 1111    Subjective Pt reports MD recommending DN to address ongoing tightness. No pain today, just tightness.    Pertinent History L4-5 fusion - 06/10/17    How long can you stand comfortably? 60-90 minutes    Diagnostic tests CT lumbar 09/08/19: Postoperative changes at L4-L5 with developing interbody arthrodesis suspected and no adverse features. Stable since 2019 adjacent L3-L4 and L5-S1 degeneration. Mild bilateral L3 foraminal stenosis and chronically degenerated left L5-S1 assimilation joint.    Patient Stated Goals "to find some relief or be ready to move on to the next step (surgery)"    Currently in Pain? No/denies              [facility] PT Assessment - 09/05/20 1108      Assessment   Medical Diagnosis Lumbar spondylolithesis    Referring Provider (PT) Julien Girt. Saintclair Halsted,  MD    Onset Date/Surgical Date --   Oct 2020     Observation/Other Assessments   Focus on Therapeutic Outcomes (FOTO)  Lumbar - 72% (28% limitation)      AROM   Lumbar Flexion fingertips to toes    Lumbar Extension 10% limited    Lumbar - Right Side Bend hand to mid gastroc    Lumbar - Left Side Bend hand to mid gastroc    Lumbar - Right Rotation WFL    Lumbar - Left Rotation [facility]                         [facility] Adult PT Treatment/Exercise - 09/05/20 1108      Exercises   Exercises Lumbar      Lumbar Exercises: Stretches   Lower Trunk Rotation 30 seconds;2 reps    ITB Stretch Right;Left;30 seconds;2 reps    ITB Stretch Limitations supine cross-body    Piriformis Stretch Right;Left;30 seconds;3 reps    Piriformis Stretch Limitations supine KTOS    Figure 4 Stretch 30 seconds;3 reps;Supine;With overpressure      Lumbar Exercises: Aerobic   Nustep L6 x 6 min (LE/UE)      Manual Therapy   Manual Therapy Soft tissue mobilization;Myofascial release  Manual therapy comments skilled palpation and monitoring during DN    Soft tissue mobilization STM/DTM to B lumbar paraspinals, L glutes and piriformis    Myofascial Release manual TPR to L lumbar paraspinals, L glutes and piriformis; pin & stretch to L lumbar paraspinals and piriformis            Trigger Point Dry Needling - 09/05/20 1108    Consent Given? Yes    Education Handout Provided Yes    Muscles Treated Back/Hip Lumbar multifidi;Erector spinae;Gluteus minimus;Piriformis    Gluteus Minimus Response Twitch response elicited;Palpable increased muscle length   Lt   Piriformis Response Twitch response elicited;Palpable increased muscle length   Lt   Erector spinae Response Twitch response elicited;Palpable increased muscle length   Lt   Lumbar multifidi Response Twitch response elicited;Palpable increased muscle length   Bilateral               PT Education - 09/05/20 1115    Education Details DN  rational, procedures, outcomes, potential side effects, and recommended post-treatment exercises/activity    Person(s) Educated Patient    Methods Explanation;Handout    Comprehension Verbalized understanding            PT Short Term Goals - 08/08/20 1407      PT SHORT TERM GOAL #1   Title Patient will be independent with initial HEP    Status Achieved   07/25/20     PT SHORT TERM GOAL #2   Title Patient will verbalize/demonstrate understanding of neutral spine posture and proper body mechanics to reduce strain on lumbar spine    Status Achieved   07/29/20            PT Long Term Goals - 09/05/20 1114      PT LONG TERM GOAL #1   Title Patient will be independent with ongoing/advanced HEP    Status Partially Met    Target Date 10/03/20      PT LONG TERM GOAL #2   Title Patient to demonstrate appropriate posture and body mechanics needed for daily activities    Status Achieved   09/05/20     PT LONG TERM GOAL #3   Title Patient to demonstrate improved tissue quality and pliability as noted by reduced tissue tightness, tenderness to palpation and improved flexibility    Status On-going    Target Date 10/03/20      PT LONG TERM GOAL #4   Title Patient to report pain reduction in frequency and intensity by >/= 50%    Status Partially Met   09/05/20 - pt reports 25% improvement in pain this far, with pain less frequently triggered with normal activities   Target Date 10/03/20      PT LONG TERM GOAL #5   Title Patient will improve standing and/or walking tolerance to >/= 30 minutes w/o pain interference to allow resumption of normal daily activities    Status Achieved   09/05/20                Plan - 09/05/20 1113    Clinical Impression Statement Nil reports 25% improvement in pain since start of PT with frequency of pain most reduced, although less reduction in intensity of pain. FOTO has improved to only 28% limitation (exceeding predicted level of 33%) and he  reports improved standing tolerance to >60-90 minutes. STGs met and most LTGs at least partially met other than LTG #3 ongoing - He notes ongoing tightness, predominantly in the L lumbar  paraspinals and upper glutes, and states he will get some temporary relief for use of massage gun at home. MD recommended DN for this, therefore after explanation of DN rational, procedures, outcomes and potential side effects, patient verbalized consent to DN treatment in conjunction with manual STM/DTM and TPR to reduce ttp/muscle tension. Muscles treated include B lumbar multifidi, L erector spinae, L glute medius and L piriformis. DN produced normal response with good twitches elicited resulting in palpable reduction in pain/ttp and muscle tension - Pt reporting the best relief he has had since surgery following DN. Pt educated to expect mild to moderate muscle soreness for up to 24-48 hrs and instructed to continue prescribed home exercise program and current activity level with pt verbalizing understanding of theses instructions. Given ongoing deficits and positive response to initial DN treatment, will recommend recert for continued PT 1x/wk for up to 4 wks.    Comorbidities L4-5 fusion - 06/10/17, HTN, anxiety    Rehab Potential Good    PT Frequency 1x / week    PT Duration 4 weeks    PT Treatment/Interventions ADLs/Self Care Home Management;Cryotherapy;Electrical Stimulation;Iontophoresis 29m/ml Dexamethasone;Moist Heat;Traction;Functional mobility training;Therapeutic activities;Therapeutic exercise;Neuromuscular re-education;Patient/family education;Manual techniques;Passive range of motion;Dry needling;Taping    PT Next Visit Plan Lumbopelvic flexibilty and strengthening; manual therapy including DN as indicated and modalites PRN; review posture and body mechancis education PRN    Consulted and Agree with Plan of Care Patient           Patient will benefit from skilled therapeutic intervention in order to  improve the following deficits and impairments:  Decreased activity tolerance,Decreased endurance,Decreased mobility,Decreased range of motion,Decreased strength,Difficulty walking,Increased muscle spasms,Increased fascial restricitons,Impaired perceived functional ability,Impaired flexibility,Improper body mechanics,Postural dysfunction,Pain  Visit Diagnosis: Chronic bilateral low back pain without sciatica  Muscle spasm of back  Other symptoms and signs involving the musculoskeletal system  Difficulty in walking, not elsewhere classified     Problem List Patient Active Problem List   Diagnosis Date Noted  . Spondylolisthesis at L4-L5 level 06/10/2017    JPercival Spanish PT, MPT 09/05/2020, 12:15 PM  [facility] [place] [address]Phone: [phone]  Fax:  [phone] Name: RHAWTHORNE DAYMRN: [id number] of Birth: [birth year]

## 2020-09-09 ENCOUNTER — Other Ambulatory Visit: Payer: Self-pay

## 2020-09-09 ENCOUNTER — Ambulatory Visit: Payer: BC Managed Care – PPO | Admitting: Physical Therapy

## 2020-09-09 DIAGNOSIS — R29898 Other symptoms and signs involving the musculoskeletal system: Secondary | ICD-10-CM

## 2020-09-09 DIAGNOSIS — G8929 Other chronic pain: Secondary | ICD-10-CM

## 2020-09-09 DIAGNOSIS — R262 Difficulty in walking, not elsewhere classified: Secondary | ICD-10-CM

## 2020-09-09 DIAGNOSIS — M62830 Muscle spasm of back: Secondary | ICD-10-CM

## 2020-09-09 DIAGNOSIS — M545 Low back pain, unspecified: Secondary | ICD-10-CM | POA: Diagnosis not present

## 2020-09-09 NOTE — Therapy (Addendum)
[facility] [address]  [place], [place], [address] Phone: [phone]   Fax:  [phone]  Physical Therapy Treatment  Patient Details  Name: Albert Skinner MRN: [id number] Date of Birth: 1981-04-17 Referring Provider (PT): Julien Girt. Saintclair Halsted, MD   Encounter Date: 09/09/2020   PT End of Session - 09/09/20 1029    Visit Number 9    Number of Visits 12    Date for PT Re-Evaluation 10/03/20    Authorization Type BCBS    PT Start Time 1029    PT Stop Time 1112    PT Time Calculation (min) 43 min    Activity Tolerance Patient tolerated treatment well    Behavior During Therapy WFL for tasks assessed/performed           Past Medical History:  Diagnosis Date  . Anxiety   . Fatty liver   . Hypertension   . Sleep apnea    uses CPAP    Past Surgical History:  Procedure Laterality Date  . WISDOM TOOTH EXTRACTION      There were no vitals filed for this visit.   Subjective Assessment - 09/09/20 1031    Subjective Pt reports he felt "outstanding" after DN last visit and woke up w/o normal stiffness for the fist time in a long time. Spent the weekend hanging dry wall and now just soreness.    Pertinent History L4-5 fusion - 06/10/17    Diagnostic tests CT lumbar 09/08/19: Postoperative changes at L4-L5 with developing interbody arthrodesis suspected and no adverse features. Stable since 2019 adjacent L3-L4 and L5-S1 degeneration. Mild bilateral L3 foraminal stenosis and chronically degenerated left L5-S1 assimilation joint.    Patient Stated Goals "to find some relief or be ready to move on to the next step (surgery)"    Currently in Pain? Yes    Pain Score 2     Pain Location Back    Pain Orientation Lower    Pain Descriptors / Indicators Sore    Pain Type Chronic pain    Pain Frequency Intermittent                             [facility] Adult PT Treatment/Exercise - 09/09/20 1029       Exercises   Exercises Lumbar      Lumbar Exercises: Stretches   Double Knee to Chest Stretch 30 seconds;1 rep    Piriformis Stretch Right;Left;30 seconds;3 reps    Piriformis Stretch Limitations supine KTOS    Other Lumbar Stretch Exercise R/L open book stretch x 30 sec      Lumbar Exercises: Aerobic   Nustep L6 x 6 min (LE/UE)      Lumbar Exercises: Supine   Bridge 15 reps;5 seconds    Bridge Limitations + blue TB hip ABD isometric      Lumbar Exercises: Sidelying   Clam Right;Left;10 reps;3 seconds    Clam Limitations blue TB      Lumbar Exercises: Quadruped   Opposite Arm/Leg Raise Right arm/Left leg;Left arm/Right leg;10 reps;3 seconds    Opposite Arm/Leg Raise Limitations cues for neutral spine           Manual Therapy   Manual Therapy Soft tissue mobilization;Myofascial release    Manual therapy comments skilled palpation and monitoring during DN    Soft tissue mobilization STM/DTM to B lumbar paraspinals, L glutes and piriformis    Myofascial Release  manual TPR to L lumbar paraspinals, L glutes and piriformis; pin & stretch to L lumbar paraspinals and piriformis       Trigger Point Dry Needling - 09/09/20 1029    Consent Given? Yes    Muscles Treated Back/Hip Lumbar multifidi;Erector spinae;Gluteus medius   Bilateral   Gluteus Medius Response Twitch response elicited;Palpable increased muscle length    Erector spinae Response Twitch response elicited;Palpable increased muscle length    Lumbar multifidi Response Twitch response elicited;Palpable increased muscle length                PT Education - 09/09/20 1110    Education Details HEP resistance progressed to blue TB    Person(s) Educated Patient    Methods Explanation;Demonstration    Comprehension Verbalized understanding;Returned demonstration            PT Short Term Goals - 08/08/20 1407      PT SHORT TERM GOAL #1   Title Patient will be independent with initial HEP    Status Achieved    07/25/20     PT SHORT TERM GOAL #2   Title Patient will verbalize/demonstrate understanding of neutral spine posture and proper body mechanics to reduce strain on lumbar spine    Status Achieved   07/29/20            PT Long Term Goals - 09/05/20 1114      PT LONG TERM GOAL #1   Title Patient will be independent with ongoing/advanced HEP    Status Partially Met    Target Date 10/03/20      PT LONG TERM GOAL #2   Title Patient to demonstrate appropriate posture and body mechanics needed for daily activities    Status Achieved   09/05/20     PT LONG TERM GOAL #3   Title Patient to demonstrate improved tissue quality and pliability as noted by reduced tissue tightness, tenderness to palpation and improved flexibility    Status On-going    Target Date 10/03/20      PT LONG TERM GOAL #4   Title Patient to report pain reduction in frequency and intensity by >/= 50%    Status Partially Met   09/05/20 - pt reports 25% improvement in pain this far, with pain less frequently triggered with normal activities   Target Date 10/03/20      PT LONG TERM GOAL #5   Title Patient will improve standing and/or walking tolerance to >/= 30 minutes w/o pain interference to allow resumption of normal daily activities    Status Achieved   09/05/20                Plan - 09/09/20 1034    Clinical Impression Statement Albert Skinner reports excellent relief of muscle tension from DN last visit although he notes increasing tightness/soreness since spending the weekend hanging dry wall for his office. Addressed areas of increasing tension with additional DN today with good twitch responses elicited, followed by further stretching to reinforce increased flexibility. Reviewed strengthening program, progressing resistance to blue bands with good tolerance.    Comorbidities L4-5 fusion - 06/10/17, HTN, anxiety    Rehab Potential Good    PT Frequency 1x / week    PT Duration 4 weeks    PT Treatment/Interventions  ADLs/Self Care Home Management;Cryotherapy;Electrical Stimulation;Iontophoresis 4mg/ml Dexamethasone;Moist Heat;Traction;Functional mobility training;Therapeutic activities;Therapeutic exercise;Neuromuscular re-education;Patient/family education;Manual techniques;Passive range of motion;Dry needling;Taping    PT Next Visit Plan Lumbopelvic flexibilty and strengthening; manual therapy including DN as   indicated and modalites PRN; review posture and body mechancis education PRN    Consulted and Agree with Plan of Care Patient           Patient will benefit from skilled therapeutic intervention in order to improve the following deficits and impairments:  Decreased activity tolerance,Decreased endurance,Decreased mobility,Decreased range of motion,Decreased strength,Difficulty walking,Increased muscle spasms,Increased fascial restricitons,Impaired perceived functional ability,Impaired flexibility,Improper body mechanics,Postural dysfunction,Pain  Visit Diagnosis: Chronic bilateral low back pain without sciatica  Muscle spasm of back  Other symptoms and signs involving the musculoskeletal system  Difficulty in walking, not elsewhere classified     Problem List Patient Active Problem List   Diagnosis Date Noted  . Spondylolisthesis at L4-L5 level 06/10/2017    JoAnne M Kreis, PT, MPT 09/09/2020, 11:26 AM   [facility] [place] [address]  [address] [place], [place], [address] Phone: [phone]   Fax:  [phone]  Name: Christhoper J Chavis MRN: [id number] Date of Birth: 01/17/1981   

## 2020-09-18 ENCOUNTER — Ambulatory Visit: Payer: BC Managed Care – PPO

## 2020-09-23 ENCOUNTER — Other Ambulatory Visit: Payer: Self-pay

## 2020-09-23 ENCOUNTER — Encounter: Payer: Self-pay | Admitting: Physical Therapy

## 2020-09-23 ENCOUNTER — Ambulatory Visit: Payer: BC Managed Care – PPO | Admitting: Physical Therapy

## 2020-09-23 DIAGNOSIS — R29898 Other symptoms and signs involving the musculoskeletal system: Secondary | ICD-10-CM

## 2020-09-23 DIAGNOSIS — M545 Low back pain, unspecified: Secondary | ICD-10-CM | POA: Diagnosis not present

## 2020-09-23 DIAGNOSIS — G8929 Other chronic pain: Secondary | ICD-10-CM

## 2020-09-23 DIAGNOSIS — R262 Difficulty in walking, not elsewhere classified: Secondary | ICD-10-CM

## 2020-09-23 DIAGNOSIS — M62830 Muscle spasm of back: Secondary | ICD-10-CM

## 2020-09-23 NOTE — Therapy (Addendum)
[facility] [place] [address]  [place], [place], [address] Phone: [phone]   Fax:  [phone]  Physical Therapy Treatment / Progress Note / Discharge Summary   Patient Details  Name: Albert Skinner MRN: [id number] Date of Birth: [birth year] Referring Provider (PT): Albert Skinner. Albert Halsted, MD  Progress Note  Reporting Period 07/15/2020 to 09/23/2020  See note below for Objective Data and Assessment of Progress/Goals.      Encounter Date: 09/23/2020   PT End of Session - 09/23/20 1105    Visit Number 10    Number of Visits 12    Date for PT Re-Evaluation 10/03/20    Authorization Type BCBS    PT Start Time 1105    PT Stop Time 1156    PT Time Calculation (min) 51 min    Activity Tolerance Patient tolerated treatment well    Behavior During Therapy WFL for tasks assessed/performed           Past Medical History:  Diagnosis Date  . Anxiety   . Fatty liver   . Hypertension   . Sleep apnea    uses CPAP    Past Surgical History:  Procedure Laterality Date  . WISDOM TOOTH EXTRACTION      There were no vitals filed for this visit.   Subjective Assessment - 09/23/20 1109    Subjective Pt reports more tightness than pain with just mild discomfort in the usual area in his back following extended period of standing while sanding to prep the walls as part of his home remodel.    Pertinent History L4-5 fusion - 06/10/17    How long can you stand comfortably? 60-90 minutes    How long can you walk comfortably? at least 1 mile    Diagnostic tests CT lumbar 09/08/19: Postoperative changes at L4-L5 with developing interbody arthrodesis suspected and no adverse features. Stable since 2019 adjacent L3-L4 and L5-S1 degeneration. Mild bilateral L3 foraminal stenosis and chronically degenerated left L5-S1 assimilation joint.    Patient Stated Goals "to find some relief or be ready to move on to the next step (surgery)"     Currently in Pain? Yes    Pain Score 1     Pain Location Back    Pain Orientation Lower    Pain Descriptors / Indicators Discomfort    Pain Type Chronic pain    Pain Frequency Intermittent              [facility] PT Assessment - 09/23/20 1105      Assessment   Medical Diagnosis Lumbar spondylolithesis    Referring Provider (PT) Albert Skinner. Albert Halsted, MD    Next MD Visit none scheduled      Observation/Other Assessments   Focus on Therapeutic Outcomes (FOTO)  Lumbar - 70% (30% limitation)      AROM   Lumbar Flexion fingertips to toes    Lumbar Extension WFL but "pinching" in upper lumber spine    Lumbar - Right Side Bend hand to mid gastroc    Lumbar - Left Side Bend hand to mid gastroc - "pinch" at SIJ    Lumbar - Right Rotation [facility]    Lumbar - Left Rotation [facility]      Strength   Right Hip Flexion 5/5    Right Hip Extension 5/5    Right Hip External Rotation  5/5    Right Hip Internal Rotation 5/5    Right Hip ABduction 5/5  Right Hip ADduction 5/5   5-/5   Left Hip Flexion 5/5    Left Hip Extension 5/5   5-/5   Left Hip External Rotation 5/5   5-/5   Left Hip Internal Rotation 5/5   pain in L hip   Left Hip ABduction 5/5    Left Hip ADduction 5/5    Right Knee Flexion 5/5    Right Knee Extension 5/5    Left Knee Flexion 5/5    Left Knee Extension 5/5    Right Ankle Dorsiflexion 5/5    Right Ankle Plantar Flexion 5/5    Left Ankle Dorsiflexion 5/5    Left Ankle Plantar Flexion 5/5                         [facility] Adult PT Treatment/Exercise - 09/23/20 1105      Exercises   Exercises Lumbar      Lumbar Exercises: Aerobic   Nustep L6 x 7 min (LE/UE)      Manual Therapy   Manual Therapy Soft tissue mobilization;Myofascial release    Manual therapy comments skilled palpation and monitoring during DN    Soft tissue mobilization STM/DTM to B lumbar paraspinals, L glutes and piriformis    Myofascial Release manual TPR to L lumbar paraspinals, L glutes and  piriformis; pin & stretch to L lumbar paraspinals and piriformis            Trigger Point Dry Needling - 09/23/20 1105    Consent Given? Yes    Muscles Treated Back/Hip Lumbar multifidi;Erector spinae;Gluteus minimus;Gluteus medius;Piriformis   Left   Gluteus Minimus Response Twitch response elicited;Palpable increased muscle length    Gluteus Medius Response Twitch response elicited;Palpable increased muscle length    Piriformis Response Twitch response elicited;Palpable increased muscle length    Erector spinae Response Twitch response elicited;Palpable increased muscle length    Lumbar multifidi Response Twitch response elicited;Palpable increased muscle length                  PT Short Term Goals - 08/08/20 1407      PT SHORT TERM GOAL #1   Title Patient will be independent with initial HEP    Status Achieved   07/25/20     PT SHORT TERM GOAL #2   Title Patient will verbalize/demonstrate understanding of neutral spine posture and proper body mechanics to reduce strain on lumbar spine    Status Achieved   07/29/20            PT Long Term Goals - 09/23/20 1112      PT LONG TERM GOAL #1   Title Patient will be independent with ongoing/advanced HEP    Status Partially Met   09/23/20 - met for current HEP     PT LONG TERM GOAL #2   Title Patient to demonstrate appropriate posture and body mechanics needed for daily activities    Status Achieved   09/05/20     PT LONG TERM GOAL #3   Title Patient to demonstrate improved tissue quality and pliability as noted by reduced tissue tightness, tenderness to palpation and improved flexibility    Status Partially Met      PT LONG TERM GOAL #4   Title Patient to report pain reduction in frequency and intensity by >/= 50%    Status Partially Met   09/23/20 - pt reports 40% improvement in pain since start of PT     PT LONG  TERM GOAL #5   Title Patient will improve standing and/or walking tolerance to >/= 30 minutes w/o pain  interference to allow resumption of normal daily activities    Status Achieved   09/05/20                Plan - 09/23/20 1156    Clinical Impression Statement Albert Skinner reports 40% improvement in pain since start of PT, noting more tightness and mild discomfort than pain at this point, although muscle tension and ttp has also improved. Lumbar AROM now [facility] other than residual tightness noted and B LE strength now 5-/5 to 5/5. Manual therapy and DN addressing increased muscle tension and ttp identified today. All goals now met or at least partially met. Pt feels confident with current HEP and expressed interest in 30-day hold for PT pending verification of new insurance coverage starting in 2022.    Comorbidities L4-5 fusion - 06/10/17, HTN, anxiety    Rehab Potential Good    PT Frequency 1x / week    PT Duration 4 weeks    PT Treatment/Interventions ADLs/Self Care Home Management;Cryotherapy;Electrical Stimulation;Iontophoresis 39m/ml Dexamethasone;Moist Heat;Traction;Functional mobility training;Therapeutic activities;Therapeutic exercise;Neuromuscular re-education;Patient/family education;Manual techniques;Passive range of motion;Dry needling;Taping    PT Next Visit Plan 30-day hold    Consulted and Agree with Plan of Care Patient           Patient will benefit from skilled therapeutic intervention in order to improve the following deficits and impairments:  Decreased activity tolerance,Decreased endurance,Decreased mobility,Decreased range of motion,Decreased strength,Difficulty walking,Increased muscle spasms,Increased fascial restricitons,Impaired perceived functional ability,Impaired flexibility,Improper body mechanics,Postural dysfunction,Pain  Visit Diagnosis: Chronic bilateral low back pain without sciatica  Muscle spasm of back  Other symptoms and signs involving the musculoskeletal system  Difficulty in walking, not elsewhere classified     Problem List Patient Active  Problem List   Diagnosis Date Noted  . Spondylolisthesis at L4-L5 level 06/10/2017    JPercival Spanish12/27/2021, 2:46 PM  [facility] [place] [address]Phone: [phone]  Fax:  [phone] Name: Albert KELNHOFERMRN: [id number] of Birth: 603-02-1981  PHYSICAL THERAPY DISCHARGE SUMMARY  Visits from Start of Care: 10  Current functional level related to goals / functional outcomes:   Refer to above clinical impression for status as of last visit on 09/23/2020. Patient was placed on hold for 30 days and has not needed to return to PT, therefore will proceed with discharge from PT for this episode.   Remaining deficits:   As above.   Education / Equipment:   HEP, pBiomedical scientisteducation  Plan: Patient agrees to discharge.  Patient goals were partially met. Patient is being discharged due to being pleased with the current functional level.  ?????     JPercival Spanish PT, MPT 11/05/20, 9:15 AM  [facility] [place] [address]Phone: [phone]  Fax:  [phone]

## 2022-10-27 NOTE — Therapy (Signed)
OUTPATIENT PHYSICAL THERAPY THORACOLUMBAR EVALUATION   Patient Name: Albert Skinner MRN: [id number] DOB:03/02/1981, 42 y.o., male Today's Date: 10/28/2022  END OF SESSION:  PT End of Session - 10/28/22 0932     Visit Number 1    Number of Visits 8    Date for PT Re-Evaluation 11/25/22    Authorization Type [employer]    PT Start Time 0932    PT Stop Time 1015    PT Time Calculation (min) 43 min    Activity Tolerance Patient tolerated treatment well    Behavior During Therapy WFL for tasks assessed/performed             Past Medical History:  Diagnosis Date   Anxiety    Fatty liver    Hypertension    Sleep apnea    uses CPAP   Past Surgical History:  Procedure Laterality Date   WISDOM TOOTH EXTRACTION     Patient Active Problem List   Diagnosis Date Noted   Spondylolisthesis at L4-L5 level 06/10/2017    PCP: Patient, No Pcp Per   REFERRING PROVIDER: Kary Kos, MD   REFERRING DIAG: M43.16 (ICD-10-CM) - Spondylolisthesis, lumbar region   Rationale for Evaluation and Treatment: Rehabilitation  THERAPY DIAG:  Other low back pain  Muscle weakness (generalized)  Other muscle spasm  ONSET DATE: 2021  SUBJECTIVE:                                                                                                                                                                                           SUBJECTIVE STATEMENT: Patient was pain free for a year or so after L4/5 fusion in 2018 then had increased pain. Recent MRI shows deterioration of L3/4 and spondylolisthesis and L5/S1 has some issues per pt. . Injection of L5/S1 which helped with sharp pain, but now feeling left low back pain. Wants DN. He reports 3 incidences of his R thigh going numb since Halloween. Each time standing or walking. Hasn't occurred since Christmas.  PERTINENT HISTORY:  Anxiety, HTN, L4/5 fusion  PAIN:  Are you having pain? Yes: NPRS scale: 2-3 up to 6/10 Pain location: left low  back Pain description: constant dull occcassional left sharp pains  Aggravating factors: standing in one spot Relieving factors: lying on back  PRECAUTIONS: None  WEIGHT BEARING RESTRICTIONS: No  FALLS:  Has patient fallen in last 6 months? No  LIVING ENVIRONMENT: Lives with: lives with their family Lives in: House/apartment Stairs: Yes: Internal: 13 steps; no issue with stairs Has following equipment at home: None  OCCUPATION: work from home; desk job  PLOF: Independent  PATIENT GOALS: decrease  pain  NEXT MD VISIT: 11/12/22  OBJECTIVE:   DIAGNOSTIC FINDINGS:   CT 09/08/19 IMPRESSION: 1. Query left side pain, and if so consider Acute Diverticulitis of the distal colon which may be partially visible on series 3, image 104. Follow-up CT Abdomen and Pelvis with oral and IV contrast would best evaluate further.   2. Postoperative changes at L4-L5 with developing interbody arthrodesis suspected and no adverse features.   3. Stable since 2019 adjacent L3-L4 and L5-S1 degeneration. Mild bilateral L3 foraminal stenosis and chronically degenerated left L5-S1 assimilation joint.  PATIENT SURVEYS:  Modified Oswestry 12 / 50 = 24.0 %   SCREENING FOR RED FLAGS: negative  COGNITION: Overall cognitive status: Within functional limits for tasks assessed     SENSATION: WFL  MUSCLE LENGTH: HS: bil tightness Quads: WNL ITB: Piriformis:marked bil Hip Flexors: WNL Heelcords:   POSTURE: rounded shoulders, forward head, increased lumbar lordosis, and anterior pelvic tilt  PALPATION: L L5 TTP, Left gluteals espcially near PSIS No pain with PA mobs lumbar spine; step off at L3/4  LUMBAR ROM: WNL pain in L low back with R SB, stretch with R SB  LOWER EXTREMITY ROM:   WFL   LOWER EXTREMITY MMT:   Grossly 5/5, but notable core weakness L>R with MMT in sitting and prone.   TODAY'S TREATMENT:                                                                                                                               DATE:   10/28/22  Initial HEP Manual Therapy: to decrease muscle spasm and pain and improve mobility  Skilled palpation and monitoring of soft tissues during DN  STM to left gluteals Trigger Point Dry-Needling  Treatment instructions: Expect mild to moderate muscle soreness. S/S of pneumothorax if dry needled over a lung field, and to seek immediate medical attention should they occur. Patient verbalized understanding of these instructions and education. Patient Consent Given: Yes Education handout provided: Yes Muscles treated: Left gluteals, lumbar multifidi Electrical stimulation performed: No Parameters: N/A Treatment response/outcome: Twitch Response Elicited and Palpable Increase in Muscle Length   PATIENT EDUCATION:  Education details: PT eval findings, anticipated POC, initial HEP, postural awareness, and DN rational, procedure, outcomes, potential side effects, and recommended post-treatment exercises/activity  Person educated: Patient Education method: Explanation, Demonstration, and Handouts Education comprehension: verbalized understanding and returned demonstration  HOME EXERCISE PROGRAM: Access Code: [email or web address] URL: [email or web address] Date: 10/28/2022 Prepared by: Almyra Free  Exercises - Supine Hamstring Stretch with Strap  - 2 x daily - 7 x weekly - 2 sets - 3 reps - 30 sec hold - Supine Piriformis Stretch  - 2 x daily - 7 x weekly - 1 sets - 3 reps - 30-60 sec hold - Standing Quadratus Lumborum Stretch with Doorway  - 2 x daily - 7 x weekly - 1 sets - 2 reps - 60 sec hold  ASSESSMENT:  CLINICAL IMPRESSION: Patient is a  42 y.o. male who was seen today for physical therapy evaluation and treatment for low back pain. He has a h/o of L4/5 fusion. Recent MRI positive for L3/4 spondylolisthesis. He has pain in the left low back mainly with standing and walking. He has increased lordosis in standing, poor seated  and standing posture and weak core muscles. He also has hip flexibility deficits. He has had relief with DN in the past so initial trial completed today to left gluteals and lumbar with good twitch response. [name] will benefit from skilled PT to address these deficits.   OBJECTIVE IMPAIRMENTS: decreased activity tolerance, decreased strength, increased muscle spasms, impaired flexibility, postural dysfunction, and pain.   ACTIVITY LIMITATIONS: standing and locomotion level  PARTICIPATION LIMITATIONS:  when pain present  PERSONAL FACTORS: Time since onset of injury/illness/exacerbation and 1-2 comorbidities: anxiety, L4/5 fusion  are also affecting patient's functional outcome.   REHAB POTENTIAL: Excellent  CLINICAL DECISION MAKING: Stable/uncomplicated  EVALUATION COMPLEXITY: Low   GOALS: Goals reviewed with patient? Yes  SHORT TERM GOALS: Target date: 11/11/2022 (Remove Blue Hyperlink)  Patient will be independent with initial HEP.  Baseline:  Goal status: INITIAL    LONG TERM GOALS: Target date: 11/25/2022  (Remove Blue Hyperlink)  Patient will be independent with advanced/ongoing HEP to improve outcomes and carryover.  Baseline:  Goal status: INITIAL  2.  Patient will report 75% improvement in low back pain with standing ADLS to improve QOL.  Baseline:  Goal status: INITIAL  3.   Patient will demonstrate improved functional core strength as demonstrated by ability to stabilize with seated and prone MMT of LE. Baseline:  Goal status: INITIAL  4.  Patient will report TBD on lumbar FOTO to demonstrate improved functional ability.  Baseline: TBD Goal status: INITIAL   5.  Patient to demonstrate ability to achieve and maintain good spinal alignment/posturing and body mechanics needed for daily activities. Baseline: increased lordosis and ant pelvic tilt Goal status: INITIAL   6.  Improved Mod Oswestry to <= 12% to demonstrate improved function Baseline: 12 / 50 = 24.0  % Goal status: INITIAL   PLAN:  PT FREQUENCY: 2x/week  PT DURATION: 4 weeks  PLANNED INTERVENTIONS: Therapeutic exercises, Therapeutic activity, Neuromuscular re-education, Patient/Family education, Self Care, Joint mobilization, Dry Needling, Electrical stimulation, Spinal mobilization, Cryotherapy, Moist heat, Taping, and Manual therapy.  PLAN FOR NEXT SESSION: Complete FOTO, assess DN, focus on core strengthening, avoid extension exercises   Jacinda Kanady, PT 10/28/2022, 12:45 PM

## 2022-10-28 ENCOUNTER — Other Ambulatory Visit: Payer: Self-pay

## 2022-10-28 ENCOUNTER — Encounter: Payer: Self-pay | Admitting: Physical Therapy

## 2022-10-28 ENCOUNTER — Ambulatory Visit: Payer: 59 | Attending: Neurosurgery | Admitting: Physical Therapy

## 2022-10-28 DIAGNOSIS — M5459 Other low back pain: Secondary | ICD-10-CM | POA: Diagnosis not present

## 2022-10-28 DIAGNOSIS — M6281 Muscle weakness (generalized): Secondary | ICD-10-CM

## 2022-10-28 DIAGNOSIS — M62838 Other muscle spasm: Secondary | ICD-10-CM | POA: Diagnosis present

## 2022-11-04 ENCOUNTER — Ambulatory Visit: Payer: 59

## 2022-11-10 ENCOUNTER — Ambulatory Visit: Payer: 59 | Attending: Neurosurgery | Admitting: Physical Therapy

## 2022-11-10 ENCOUNTER — Encounter: Payer: Self-pay | Admitting: Physical Therapy

## 2022-11-10 DIAGNOSIS — M62838 Other muscle spasm: Secondary | ICD-10-CM | POA: Insufficient documentation

## 2022-11-10 DIAGNOSIS — M6281 Muscle weakness (generalized): Secondary | ICD-10-CM | POA: Diagnosis present

## 2022-11-10 DIAGNOSIS — M5459 Other low back pain: Secondary | ICD-10-CM | POA: Diagnosis present

## 2022-11-10 NOTE — Therapy (Signed)
OUTPATIENT PHYSICAL THERAPY  TREATMENT    Patient Name: Albert Skinner MRN: [id number] DOB:[birth year], 42 y.o., male Today's Date: 11/10/2022  END OF SESSION:  PT End of Session - 11/10/22 1658     Visit Number 2    Number of Visits 8    Date for PT Re-Evaluation 11/25/22    Authorization Type [employer]    PT Start Time 1700    PT Stop Time 1743    PT Time Calculation (min) 43 min    Activity Tolerance Patient tolerated treatment well    Behavior During Therapy WFL for tasks assessed/performed              Past Medical History:  Diagnosis Date   Anxiety    Fatty liver    Hypertension    Sleep apnea    uses CPAP   Past Surgical History:  Procedure Laterality Date   WISDOM TOOTH EXTRACTION     Patient Active Problem List   Diagnosis Date Noted   Spondylolisthesis at L4-L5 level 06/10/2017    PCP: Patient, No Pcp Per  REFERRING PROVIDER: Kary Kos, MD  REFERRING DIAG: M43.16 (ICD-10-CM) - Spondylolisthesis, lumbar region   RATIONALE FOR EVALUATION AND TREATMENT: Rehabilitation  THERAPY DIAG:  Other low back pain  Muscle weakness (generalized)  Other muscle spasm  ONSET DATE: 2021  NEXT MD VISIT: 11/12/22   SUBJECTIVE:                                                                                                                                                                                           SUBJECTIVE STATEMENT:  DN seemed to help a lot to loosen him up, stretches have been going well for him but still has some problems stretching his QL on the left side.   PERTINENT HISTORY:  Anxiety, HTN, L4/5 fusion  PAIN:  Are you having pain? No  PRECAUTIONS: None  WEIGHT BEARING RESTRICTIONS: No  FALLS:  Has patient fallen in last 6 months? No  LIVING ENVIRONMENT: Lives with: lives with their family Lives in: House/apartment Stairs: Yes: Internal: 13 steps; no issue with stairs Has following equipment at home: None  OCCUPATION: work from  home; desk job  PLOF: Independent  PATIENT GOALS: decrease pain   OBJECTIVE:   DIAGNOSTIC FINDINGS:   CT 09/08/19 IMPRESSION: 1. Query left side pain, and if so consider Acute Diverticulitis of the distal colon which may be partially visible on series 3, image 104. Follow-up CT Abdomen and Pelvis with oral and IV contrast would best evaluate further.   2. Postoperative changes at L4-L5 with developing interbody arthrodesis suspected and no  adverse features.   3. Stable since 2019 adjacent L3-L4 and L5-S1 degeneration. Mild bilateral L3 foraminal stenosis and chronically degenerated left L5-S1 assimilation joint.  PATIENT SURVEYS:  Modified Oswestry 12 / 50 = 24.0 %   SCREENING FOR RED FLAGS: negative  COGNITION: Overall cognitive status: Within functional limits for tasks assessed     SENSATION: WFL  MUSCLE LENGTH: HS: bil tightness Quads: WNL ITB: Piriformis:marked bil Hip Flexors: WNL Heelcords:   POSTURE: rounded shoulders, forward head, increased lumbar lordosis, and anterior pelvic tilt  PALPATION: L L5 TTP, Left gluteals espcially near PSIS No pain with PA mobs lumbar spine; step off at L3/4  LUMBAR ROM: WNL pain in L low back with R SB, stretch with R SB  LOWER EXTREMITY ROM:   WFL  LOWER EXTREMITY MMT:   Grossly 5/5, but notable core weakness L>R with MMT in sitting and prone.   TODAY'S TREATMENT:                                                                                                                              DATE:    11/10/22 THERAPEUTIC EXERCISE: to improve flexibility, strength and mobility.  Verbal and tactile cues throughout for technique. RecBike L3 x34mn  Supine Hamstring Stretch with Strap3x30"  Seated Piriformis Stretch 2x30"  SKTC 3x20"  LTR x10 each 5"hold  Open Book x10 each 5" hold Child's Pose Stx 3-Way 2x30"  MANUAL THERAPY: To promote normalized muscle tension, improved flexibility, improved joint mobility,  increased ROM, and reduced pain. Skilled palpation and monitoring of soft tissue during DN Trigger Point Dry-Needling  Treatment instructions: Expect mild to moderate muscle soreness. Patient verbalized understanding of these instructions and education. Patient Consent Given: Yes Education handout provided: Previously provided Muscles treated: L Glute Med/Min, L QL, L Erector Spinae  Electrical stimulation performed: No Parameters: N/A Treatment response/outcome: Twitch Response Elicited and Palpable Increase in Muscle Length STM/DTM, manual TPR and pin & stretch to muscles addressed with DN   10/28/22  Initial HEP Manual Therapy: to decrease muscle spasm and pain and improve mobility  Skilled palpation and monitoring of soft tissues during DN  STM to left gluteals Trigger Point Dry-Needling  Treatment instructions: Expect mild to moderate muscle soreness. S/S of pneumothorax if dry needled over a lung field, and to seek immediate medical attention should they occur. Patient verbalized understanding of these instructions and education. Patient Consent Given: Yes Education handout provided: Yes Muscles treated: Left gluteals, lumbar multifidi Electrical stimulation performed: No Parameters: N/A Treatment response/outcome: Twitch Response Elicited and Palpable Increase in Muscle Length   PATIENT EDUCATION:  Education details: progress with PT, ongoing PT POC, HEP update, postural awareness, and DN rational, procedure, outcomes, potential side effects, and recommended post-treatment exercises/activity Person educated: Patient Education method: Explanation, Demonstration, and Handouts Education comprehension: verbalized understanding and returned demonstration  HOME EXERCISE PROGRAM: Access Code: [email or web address] URL: [email or web address] Date: 11/10/2022 Prepared by: VVerdene LennertRomero-Perozo  Exercises - Supine  Hamstring Stretch with Strap  - 2 x daily - 7 x weekly - 2 sets  - 3 reps - 30 sec hold - Supine Piriformis Stretch  - 2 x daily - 7 x weekly - 1 sets - 3 reps - 30-60 sec hold - Standing Quadratus Lumborum Stretch with Doorway  - 2 x daily - 7 x weekly - 1 sets - 2 reps - 60 sec hold - Sidelying Thoracic Rotation with Open Book  - 1 x daily - 7 x weekly - 2 sets - 10 reps - 5 second hold hold - Supine Lower Trunk Rotation  - 1 x daily - 7 x weekly - 10 reps - 5 seconds  hold - Child's Pose Stretch  - 1 x daily - 7 x weekly - 2 sets - 30 seconds hold - Child's Pose with Sidebending  - 1 x daily - 7 x weekly - 2 sets - 30 seconds hold  ASSESSMENT:  CLINICAL IMPRESSION:  Pt came into today's session with no pain, just notes that he still feels tight in L QL area. Today's stretches emphasized different ways of stretching the QL/ Piriformis area to increase muscle length, especially on the L side. Pt seemed to tolerate new stretches well and seemed comfortable with his initial HEP. Session concluded with MT incorportating DN to his L gluteal and QL area to hopefully decrease the muscle tension he typically feels. Pt will continue to benefit from skilled PT to address the generalized tightness he feels across his back and hip to minimize discomfort while performing daily activities.   OBJECTIVE IMPAIRMENTS: decreased activity tolerance, decreased strength, increased muscle spasms, impaired flexibility, postural dysfunction, and pain.   ACTIVITY LIMITATIONS: standing and locomotion level  PARTICIPATION LIMITATIONS:  when pain present  PERSONAL FACTORS: Time since onset of injury/illness/exacerbation and 1-2 comorbidities: anxiety, L4/5 fusion  are also affecting patient's functional outcome.   REHAB POTENTIAL: Excellent  CLINICAL DECISION MAKING: Stable/uncomplicated  EVALUATION COMPLEXITY: Low   GOALS: Goals reviewed with patient? Yes  SHORT TERM GOALS: Target date: 11/11/2022 (Remove Blue Hyperlink)  Patient will be independent with initial HEP.   Baseline:  Goal status: IN PROGRESS  LONG TERM GOALS: Target date: 11/25/2022  (Remove Blue Hyperlink)  Patient will be independent with advanced/ongoing HEP to improve outcomes and carryover.  Baseline:  Goal status: IN PROGRESS  2.  Patient will report 75% improvement in low back pain with standing ADLS to improve QOL.  Baseline:  Goal status: IN PROGRESS  3.   Patient will demonstrate improved functional core strength as demonstrated by ability to stabilize with seated and prone MMT of LE. Baseline:  Goal status: IN PROGRESS  4.  Patient will report TBD on lumbar FOTO to demonstrate improved functional ability.  Baseline: TBD Goal status: IN PROGRESS   5.  Patient to demonstrate ability to achieve and maintain good spinal alignment/posturing and body mechanics needed for daily activities. Baseline: increased lordosis and ant pelvic tilt Goal status: IN PROGRESS  6.  Improved Mod Oswestry to <= 12% to demonstrate improved function Baseline: 12 / 50 = 24.0 % Goal status: IN PROGRESS  PLAN:  PT FREQUENCY: 2x/week  PT DURATION: 4 weeks  PLANNED INTERVENTIONS: Therapeutic exercises, Therapeutic activity, Neuromuscular re-education, Patient/Family education, Self Care, Joint mobilization, Dry Needling, Electrical stimulation, Spinal mobilization, Cryotherapy, Moist heat, Taping, and Manual therapy.  PLAN FOR NEXT SESSION: Complete FOTO, ask about update on HEP and DN response, focus on core strengthening, avoid extension exercises  Millisa Giarrusso Romero-Perozo, Student-PT 11/10/2022, 5:07 PM

## 2022-11-11 ENCOUNTER — Ambulatory Visit: Payer: 59

## 2022-11-16 ENCOUNTER — Ambulatory Visit: Payer: 59

## 2022-11-16 DIAGNOSIS — M5459 Other low back pain: Secondary | ICD-10-CM

## 2022-11-16 DIAGNOSIS — M62838 Other muscle spasm: Secondary | ICD-10-CM

## 2022-11-16 DIAGNOSIS — M6281 Muscle weakness (generalized): Secondary | ICD-10-CM

## 2022-11-16 NOTE — Therapy (Signed)
OUTPATIENT PHYSICAL THERAPY  TREATMENT    Patient Name: Albert Skinner MRN: [id number] DOB:1981/04/04, 42 y.o., male Today's Date: 11/16/2022  END OF SESSION:  PT End of Session - 11/16/22 0818     Visit Number 3    Number of Visits 8    Date for PT Re-Evaluation 11/25/22    Authorization Type [employer]    PT Start Time 0802    PT Stop Time 0845    PT Time Calculation (min) 43 min    Activity Tolerance Patient tolerated treatment well    Behavior During Therapy WFL for tasks assessed/performed               Past Medical History:  Diagnosis Date   Anxiety    Fatty liver    Hypertension    Sleep apnea    uses CPAP   Past Surgical History:  Procedure Laterality Date   WISDOM TOOTH EXTRACTION     Patient Active Problem List   Diagnosis Date Noted   Spondylolisthesis at L4-L5 level 06/10/2017    PCP: Patient, No Pcp Per  REFERRING PROVIDER: Kary Kos, MD  REFERRING DIAG: M43.16 (ICD-10-CM) - Spondylolisthesis, lumbar region   RATIONALE FOR EVALUATION AND TREATMENT: Rehabilitation  THERAPY DIAG:  Other low back pain  Muscle weakness (generalized)  Other muscle spasm  ONSET DATE: 2021  NEXT MD VISIT: 11/12/22   SUBJECTIVE:                                                                                                                                                                                           SUBJECTIVE STATEMENT:  Doing good, still tight in L side of low back. Last session went well. Doctor may do ablation in future.  PERTINENT HISTORY:  Anxiety, HTN, L4/5 fusion  PAIN:  Are you having pain? No  PRECAUTIONS: None  WEIGHT BEARING RESTRICTIONS: No  FALLS:  Has patient fallen in last 6 months? No  LIVING ENVIRONMENT: Lives with: lives with their family Lives in: House/apartment Stairs: Yes: Internal: 13 steps; no issue with stairs Has following equipment at home: None  OCCUPATION: work from home; desk job  PLOF:  Independent  PATIENT GOALS: decrease pain   OBJECTIVE:   DIAGNOSTIC FINDINGS:   CT 09/08/19 IMPRESSION: 1. Query left side pain, and if so consider Acute Diverticulitis of the distal colon which may be partially visible on series 3, image 104. Follow-up CT Abdomen and Pelvis with oral and IV contrast would best evaluate further.   2. Postoperative changes at L4-L5 with developing interbody arthrodesis suspected and no adverse features.   3. Stable since 2019 adjacent  L3-L4 and L5-S1 degeneration. Mild bilateral L3 foraminal stenosis and chronically degenerated left L5-S1 assimilation joint.  PATIENT SURVEYS:  Modified Oswestry 12 / 50 = 24.0 %   SCREENING FOR RED FLAGS: negative  COGNITION: Overall cognitive status: Within functional limits for tasks assessed     SENSATION: WFL  MUSCLE LENGTH: HS: bil tightness Quads: WNL ITB: Piriformis:marked bil Hip Flexors: WNL Heelcords:   POSTURE: rounded shoulders, forward head, increased lumbar lordosis, and anterior pelvic tilt  PALPATION: L L5 TTP, Left gluteals espcially near PSIS No pain with PA mobs lumbar spine; step off at L3/4  LUMBAR ROM: WNL pain in L low back with R SB, stretch with R SB  LOWER EXTREMITY ROM:   WFL  LOWER EXTREMITY MMT:   Grossly 5/5, but notable core weakness L>R with MMT in sitting and prone.   TODAY'S TREATMENT:                                                                                                                              DATE:   11/16/22 THERAPEUTIC EXERCISE: to improve flexibility, strength and mobility.  Verbal and tactile cues throughout for technique. RecBike L3 x25mn Seated lumbar flexion stretch w/ green pball fwd and R side bend x 30 sec Childs pose stretch x 30 sec each side LTR 3x10" hold SKTC 2x30" Supine PPT 10x5" SLR in supine 10x5" hold with TrA bil Bridge with ball squeeze 10x5'  MANUAL THERAPY: To promote normalized muscle tension, improved  flexibility, improved joint mobility, increased ROM, and reduced pain.  STM to L QL closer to iliac crest   11/10/22 THERAPEUTIC EXERCISE: to improve flexibility, strength and mobility.  Verbal and tactile cues throughout for technique. RecBike L3 x647m  Supine Hamstring Stretch with Strap3x30"  Seated Piriformis Stretch 2x30"  SKTC 3x20"  LTR x10 each 5"hold  Open Book x10 each 5" hold Child's Pose Stx 3-Way 2x30"  MANUAL THERAPY: To promote normalized muscle tension, improved flexibility, improved joint mobility, increased ROM, and reduced pain. Skilled palpation and monitoring of soft tissue during DN Trigger Point Dry-Needling  Treatment instructions: Expect mild to moderate muscle soreness. Patient verbalized understanding of these instructions and education. Patient Consent Given: Yes Education handout provided: Previously provided Muscles treated: L Glute Med/Min, L QL, L Erector Spinae  Electrical stimulation performed: No Parameters: N/A Treatment response/outcome: Twitch Response Elicited and Palpable Increase in Muscle Length STM/DTM, manual TPR and pin & stretch to muscles addressed with DN   10/28/22  Initial HEP Manual Therapy: to decrease muscle spasm and pain and improve mobility  Skilled palpation and monitoring of soft tissues during DN  STM to left gluteals Trigger Point Dry-Needling  Treatment instructions: Expect mild to moderate muscle soreness. S/S of pneumothorax if dry needled over a lung field, and to seek immediate medical attention should they occur. Patient verbalized understanding of these instructions and education. Patient Consent Given: Yes Education handout provided: Yes Muscles treated: Left gluteals,  lumbar multifidi Electrical stimulation performed: No Parameters: N/A Treatment response/outcome: Twitch Response Elicited and Palpable Increase in Muscle Length   PATIENT EDUCATION:  Education details: HEP update Person educated:  Patient Education method: Explanation, Demonstration, and Handouts Education comprehension: verbalized understanding and returned demonstration  HOME EXERCISE PROGRAM: Access Code: [email or web address] URL: [email or web address] Date: 11/16/2022 Prepared by: Clarene Essex  Exercises - Supine Hamstring Stretch with Strap  - 2 x daily - 7 x weekly - 2 sets - 3 reps - 30 sec hold - Supine Piriformis Stretch  - 2 x daily - 7 x weekly - 1 sets - 3 reps - 30-60 sec hold - Standing Quadratus Lumborum Stretch with Doorway  - 2 x daily - 7 x weekly - 1 sets - 2 reps - 60 sec hold - Sidelying Thoracic Rotation with Open Book  - 1 x daily - 7 x weekly - 2 sets - 10 reps - 5 second hold hold - Supine Lower Trunk Rotation  - 1 x daily - 7 x weekly - 10 reps - 5 seconds  hold - Child's Pose Stretch  - 1 x daily - 7 x weekly - 2 sets - 30 seconds hold - Child's Pose with Sidebending  - 1 x daily - 7 x weekly - 2 sets - 30 seconds hold - Supine Active Straight Leg Raise  - 1 x daily - 7 x weekly - 2 sets - 10 reps - 5 sec hold - Supine Posterior Pelvic Tilt  - 1 x daily - 7 x weekly - 2 sets - 10 reps - 5 sec hold - Supine Bridge with Mini Swiss Ball Between Knees  - 1 x daily - 7 x weekly - 2 sets - 10 reps - 5 sec hold  ASSESSMENT:  CLINICAL IMPRESSION:  Able to progress strengthening core exercises and flexibility with no issues. He did have problems finding a stretch to hit the lower part of the QL. Pt today noted continued tightness along his L QL muscle, closer to the iliac crest, MT done to address this. Pt reported a good report from doctor, they did have a discussion about getting nerve ablation possibly in future but not a lumbar fusion as of now.   OBJECTIVE IMPAIRMENTS: decreased activity tolerance, decreased strength, increased muscle spasms, impaired flexibility, postural dysfunction, and pain.   ACTIVITY LIMITATIONS: standing and locomotion level  PARTICIPATION LIMITATIONS:  when pain  present  PERSONAL FACTORS: Time since onset of injury/illness/exacerbation and 1-2 comorbidities: anxiety, L4/5 fusion  are also affecting patient's functional outcome.   REHAB POTENTIAL: Excellent  CLINICAL DECISION MAKING: Stable/uncomplicated  EVALUATION COMPLEXITY: Low   GOALS: Goals reviewed with patient? Yes  SHORT TERM GOALS: Target date: 11/11/2022 (Remove Blue Hyperlink)  Patient will be independent with initial HEP.  Baseline:  Goal status: MET- 11/16/22  LONG TERM GOALS: Target date: 11/25/2022  (Remove Blue Hyperlink)  Patient will be independent with advanced/ongoing HEP to improve outcomes and carryover.  Baseline:  Goal status: IN PROGRESS  2.  Patient will report 75% improvement in low back pain with standing ADLS to improve QOL.  Baseline:  Goal status: IN PROGRESS  3.   Patient will demonstrate improved functional core strength as demonstrated by ability to stabilize with seated and prone MMT of LE. Baseline:  Goal status: IN PROGRESS  4.  Patient will report TBD on lumbar FOTO to demonstrate improved functional ability.  Baseline: TBD Goal status: IN PROGRESS   5.  Patient to demonstrate ability  to achieve and maintain good spinal alignment/posturing and body mechanics needed for daily activities. Baseline: increased lordosis and ant pelvic tilt Goal status: IN PROGRESS  6.  Improved Mod Oswestry to <= 12% to demonstrate improved function Baseline: 12 / 50 = 24.0 % Goal status: IN PROGRESS  PLAN:  PT FREQUENCY: 2x/week  PT DURATION: 4 weeks  PLANNED INTERVENTIONS: Therapeutic exercises, Therapeutic activity, Neuromuscular re-education, Patient/Family education, Self Care, Joint mobilization, Dry Needling, Electrical stimulation, Spinal mobilization, Cryotherapy, Moist heat, Taping, and Manual therapy.  PLAN FOR NEXT SESSION: Complete FOTO, ask about update on HEP and DN response, focus on core strengthening, avoid extension exercises   Tereso Unangst  L Koden Hunzeker, PTA 11/16/2022, 9:12 AM

## 2022-11-18 ENCOUNTER — Encounter: Payer: 59 | Admitting: Physical Therapy

## 2022-11-18 NOTE — Therapy (Signed)
OUTPATIENT PHYSICAL THERAPY  TREATMENT    Patient Name: Albert Skinner MRN: [id number] DOB:[birth year], 42 y.o., male Today's Date: 11/19/2022  END OF SESSION:  PT End of Session - 11/19/22 0757     Visit Number 4    Number of Visits 8    Date for PT Re-Evaluation 11/25/22    Authorization Type [employer]    PT Start Time 0800    PT Stop Time 0848    PT Time Calculation (min) 48 min    Activity Tolerance Patient tolerated treatment well    Behavior During Therapy WFL for tasks assessed/performed               Past Medical History:  Diagnosis Date   Anxiety    Fatty liver    Hypertension    Sleep apnea    uses CPAP   Past Surgical History:  Procedure Laterality Date   WISDOM TOOTH EXTRACTION     Patient Active Problem List   Diagnosis Date Noted   Spondylolisthesis at L4-L5 level 06/10/2017    PCP: Patient, No Pcp Per  REFERRING PROVIDER: Kary Kos, MD  REFERRING DIAG: M43.16 (ICD-10-CM) - Spondylolisthesis, lumbar region   RATIONALE FOR EVALUATION AND TREATMENT: Rehabilitation  THERAPY DIAG:  Other low back pain  Muscle weakness (generalized)  Other muscle spasm  ONSET DATE: 2021  NEXT MD VISIT: 11/12/22   SUBJECTIVE:                                                                                                                                                                                           SUBJECTIVE STATEMENT:  I'm confused because I'm still having pain in left low back. Was walking the other day on flat surfaces and right thigh started to go numb.   PERTINENT HISTORY:  Anxiety, HTN, L4/5 fusion  PAIN:  Are you having pain? Yes: NPRS scale: consistent 1 and over the past week it has jumped to 4-5/10 in the left low back/10 Pain location: left low back Pain description: constant annoying Aggravating factors: EOD Relieving factors: Sitting with left leg extended   PRECAUTIONS: None  WEIGHT BEARING RESTRICTIONS: No  FALLS:  Has  patient fallen in last 6 months? No  LIVING ENVIRONMENT: Lives with: lives with their family Lives in: House/apartment Stairs: Yes: Internal: 13 steps; no issue with stairs Has following equipment at home: None  OCCUPATION: work from home; desk job  PLOF: Independent  PATIENT GOALS: decrease pain   OBJECTIVE:   DIAGNOSTIC FINDINGS:   CT 09/08/19 IMPRESSION: 1. Query left side pain, and if so consider Acute Diverticulitis of the distal colon which  may be partially visible on series 3, image 104. Follow-up CT Abdomen and Pelvis with oral and IV contrast would best evaluate further.   2. Postoperative changes at L4-L5 with developing interbody arthrodesis suspected and no adverse features.   3. Stable since 2019 adjacent L3-L4 and L5-S1 degeneration. Mild bilateral L3 foraminal stenosis and chronically degenerated left L5-S1 assimilation joint.  PATIENT SURVEYS:  Modified Oswestry 12 / 50 = 24.0 %   SCREENING FOR RED FLAGS: negative  COGNITION: Overall cognitive status: Within functional limits for tasks assessed     SENSATION: WFL  MUSCLE LENGTH: HS: bil tightness Quads: WNL ITB: Piriformis:marked bil Hip Flexors: WNL Heelcords:   POSTURE: rounded shoulders, forward head, increased lumbar lordosis, and anterior pelvic tilt  PALPATION: L L5 TTP, Left gluteals espcially near PSIS No pain with PA mobs lumbar spine; step off at L3/4  LUMBAR ROM: WNL pain in L low back with R SB, stretch with R SB  LOWER EXTREMITY ROM:   WFL  LOWER EXTREMITY MMT:   Grossly 5/5, but notable core weakness L>R with MMT in sitting and prone.   TODAY'S TREATMENT:                                                                                                                              DATE:    11/19/22 THERAPEUTIC EXERCISE: to improve flexibility, strength and mobility.  Verbal and tactile cues throughout for technique. RecBike L4 x37mn Seated sciatic nerve glide L x  10 Supine decompression - leg lengthener x 5 5 sec hold Supine sciatic nerve glide x 5 L Alt supine march with TA contraction x 5 each side 90/90 heel taps for TA x 10 ea, also did a few reps of full leg ext and dying bug to show progressions. Prone over pillow pelvic press plus alt hip ext x 10 Prone alt opp arm/leg x 3  Quadriped alt leg x 5 ea, then arm/leg x 5 ea Childs pose stretch x 30 sec each side   MANUAL THERAPY: To promote normalized muscle tension, improved flexibility, improved joint mobility, increased ROM, and reduced pain. Skilled palpation and monitoring of soft tissue during DN Trigger Point Dry-Needling  Treatment instructions: Expect mild to moderate muscle soreness. Patient verbalized understanding of these instructions and education. Patient Consent Given: Yes Education handout provided: Previously provided Muscles treated: L Glute Med along iliac crest, left L5 multifidi Electrical stimulation performed: No Parameters: N/A Treatment response/outcome: Twitch Response Elicited and Palpable Increase in Muscle Length   11/16/22 THERAPEUTIC EXERCISE: to improve flexibility, strength and mobility.  Verbal and tactile cues throughout for technique. RecBike L3 x62m Seated lumbar flexion stretch w/ green pball fwd and R side bend x 30 sec Childs pose stretch x 30 sec each side LTR 3x10" hold SKTC 2x30" Supine PPT 10x5" SLR in supine 10x5" hold with TrA bil Bridge with ball squeeze 10x5'  MANUAL THERAPY: To promote normalized muscle tension, improved flexibility, improved  joint mobility, increased ROM, and reduced pain.  STM to L QL closer to iliac crest   11/10/22 THERAPEUTIC EXERCISE: to improve flexibility, strength and mobility.  Verbal and tactile cues throughout for technique. RecBike L3 x59mn  Supine Hamstring Stretch with Strap3x30"  Seated Piriformis Stretch 2x30"  SKTC 3x20"  LTR x10 each 5"hold  Open Book x10 each 5" hold Child's Pose Stx 3-Way  2x30"  MANUAL THERAPY: To promote normalized muscle tension, improved flexibility, improved joint mobility, increased ROM, and reduced pain. Skilled palpation and monitoring of soft tissue during DN Trigger Point Dry-Needling  Treatment instructions: Expect mild to moderate muscle soreness. Patient verbalized understanding of these instructions and education. Patient Consent Given: Yes Education handout provided: Previously provided Muscles treated: L Glute Med/Min, L QL, L Erector Spinae  Electrical stimulation performed: No Parameters: N/A Treatment response/outcome: Twitch Response Elicited and Palpable Increase in Muscle Length STM/DTM, manual TPR and pin & stretch to muscles addressed with DN   10/28/22  Initial HEP Manual Therapy: to decrease muscle spasm and pain and improve mobility  Skilled palpation and monitoring of soft tissues during DN  STM to left gluteals Trigger Point Dry-Needling  Treatment instructions: Expect mild to moderate muscle soreness. S/S of pneumothorax if dry needled over a lung field, and to seek immediate medical attention should they occur. Patient verbalized understanding of these instructions and education. Patient Consent Given: Yes Education handout provided: Yes Muscles treated: Left gluteals, lumbar multifidi Electrical stimulation performed: No Parameters: N/A Treatment response/outcome: Twitch Response Elicited and Palpable Increase in Muscle Length   PATIENT EDUCATION:  Education details: HEP update Person educated: Patient Education method: Explanation, Demonstration, and Handouts Education comprehension: verbalized understanding and returned demonstration  HOME EXERCISE PROGRAM: Access Code: [email or web address] URL: [email or web address] Date: 11/19/2022 Prepared by: JAlmyra Free Exercises - Supine Hamstring Stretch with Strap  - 2 x daily - 7 x weekly - 2 sets - 3 reps - 30 sec hold - Supine Piriformis Stretch  - 2 x daily - 7 x  weekly - 1 sets - 3 reps - 30-60 sec hold - Standing Quadratus Lumborum Stretch with Doorway  - 2 x daily - 7 x weekly - 1 sets - 2 reps - 60 sec hold - Sidelying Thoracic Rotation with Open Book  - 1 x daily - 7 x weekly - 2 sets - 10 reps - 5 second hold hold - Supine Lower Trunk Rotation  - 1 x daily - 7 x weekly - 10 reps - 5 seconds  hold - Child's Pose Stretch  - 1 x daily - 7 x weekly - 2 sets - 30 seconds hold - Child's Pose with Sidebending  - 1 x daily - 7 x weekly - 2 sets - 30 seconds hold - Supine Active Straight Leg Raise  - 1 x daily - 7 x weekly - 2 sets - 10 reps - 5 sec hold - Supine Posterior Pelvic Tilt  - 1 x daily - 7 x weekly - 2 sets - 10 reps - 5 sec hold - Supine Bridge with Mini Swiss Ball Between Knees  - 1 x daily - 7 x weekly - 2 sets - 10 reps - 5 sec hold - Supine Sciatic Nerve Glide  - 2 x daily - 7 x weekly - 1 sets - 10 reps - 5 sec hold - Seated Sciatic Tensioner  - 2 x daily - 7 x weekly - 1 sets - 10 reps - 5 second hold - BAvery Dennison  Dog  - 1 x daily - 7 x weekly - 1-3 sets - 10 reps - 5 sec hold - Supine 90/90 leg extensions  - 1 x daily - 7 x weekly - 1-3 sets - 10 reps - 3 sed hold  ASSESSMENT:  CLINICAL IMPRESSION:  Khiry did well with increased core strengthening today and would benefit from ongoing core stengthening in standing  as well. HEP was progressed. Good response to DN near left PSIS region.   OBJECTIVE IMPAIRMENTS: decreased activity tolerance, decreased strength, increased muscle spasms, impaired flexibility, postural dysfunction, and pain.   ACTIVITY LIMITATIONS: standing and locomotion level  PARTICIPATION LIMITATIONS:  when pain present  PERSONAL FACTORS: Time since onset of injury/illness/exacerbation and 1-2 comorbidities: anxiety, L4/5 fusion  are also affecting patient's functional outcome.   REHAB POTENTIAL: Excellent  CLINICAL DECISION MAKING: Stable/uncomplicated  EVALUATION COMPLEXITY: Low   GOALS: Goals reviewed with  patient? Yes  SHORT TERM GOALS: Target date: 11/11/2022 (Remove Blue Hyperlink)  Patient will be independent with initial HEP.  Baseline:  Goal status: MET- 11/16/22  LONG TERM GOALS: Target date: 11/25/2022  (Remove Blue Hyperlink)  Patient will be independent with advanced/ongoing HEP to improve outcomes and carryover.  Baseline:  Goal status: IN PROGRESS  2.  Patient will report 75% improvement in low back pain with standing ADLS to improve QOL.  Baseline:  Goal status: IN PROGRESS  3.   Patient will demonstrate improved functional core strength as demonstrated by ability to stabilize with seated and prone MMT of LE. Baseline:  Goal status: IN PROGRESS  4.  Patient will report TBD on lumbar FOTO to demonstrate improved functional ability.  Baseline: TBD Goal status: IN PROGRESS   5.  Patient to demonstrate ability to achieve and maintain good spinal alignment/posturing and body mechanics needed for daily activities. Baseline: increased lordosis and ant pelvic tilt Goal status: IN PROGRESS  6.  Improved Mod Oswestry to <= 12% to demonstrate improved function Baseline: 12 / 50 = 24.0 % Goal status: IN PROGRESS  PLAN:  PT FREQUENCY: 2x/week  PT DURATION: 4 weeks  PLANNED INTERVENTIONS: Therapeutic exercises, Therapeutic activity, Neuromuscular re-education, Patient/Family education, Self Care, Joint mobilization, Dry Needling, Electrical stimulation, Spinal mobilization, Cryotherapy, Moist heat, Taping, and Manual therapy.  PLAN FOR NEXT SESSION:  focus on core strengthening, avoid extension exercises   Jylan Loeza, PT 11/19/2022, 3:22 PM

## 2022-11-19 ENCOUNTER — Encounter: Payer: Self-pay | Admitting: Physical Therapy

## 2022-11-19 ENCOUNTER — Ambulatory Visit: Payer: 59 | Admitting: Physical Therapy

## 2022-11-19 DIAGNOSIS — M62838 Other muscle spasm: Secondary | ICD-10-CM

## 2022-11-19 DIAGNOSIS — M6281 Muscle weakness (generalized): Secondary | ICD-10-CM

## 2022-11-19 DIAGNOSIS — M5459 Other low back pain: Secondary | ICD-10-CM | POA: Diagnosis not present

## 2022-11-24 NOTE — Therapy (Incomplete)
OUTPATIENT PHYSICAL THERAPY  TREATMENT    Patient Name: Albert Skinner MRN: [id number] DOB:February 26, 1981, 42 y.o., male Today's Date: 11/19/2022  END OF SESSION:  PT End of Session - 11/19/22 0757     Visit Number 4    Number of Visits 8    Date for PT Re-Evaluation 11/25/22    Authorization Type [employer]    PT Start Time 0800    PT Stop Time 0848    PT Time Calculation (min) 48 min    Activity Tolerance Patient tolerated treatment well    Behavior During Therapy WFL for tasks assessed/performed               Past Medical History:  Diagnosis Date   Anxiety    Fatty liver    Hypertension    Sleep apnea    uses CPAP   Past Surgical History:  Procedure Laterality Date   WISDOM TOOTH EXTRACTION     Patient Active Problem List   Diagnosis Date Noted   Spondylolisthesis at L4-L5 level 06/10/2017    PCP: Patient, No Pcp Per  REFERRING PROVIDER: Kary Kos, MD  REFERRING DIAG: M43.16 (ICD-10-CM) - Spondylolisthesis, lumbar region   RATIONALE FOR EVALUATION AND TREATMENT: Rehabilitation  THERAPY DIAG:  Other low back pain  Muscle weakness (generalized)  Other muscle spasm  ONSET DATE: 2021  NEXT MD VISIT: 11/12/22   SUBJECTIVE:                                                                                                                                                                                           SUBJECTIVE STATEMENT:  ***   PERTINENT HISTORY:  Anxiety, HTN, L4/5 fusion  PAIN:  Are you having pain? Yes: NPRS scale: consistent 1 and over the past week it has jumped to 4-5/10 in the left low back/10 Pain location: left low back Pain description: constant annoying Aggravating factors: EOD Relieving factors: Sitting with left leg extended   PRECAUTIONS: None  WEIGHT BEARING RESTRICTIONS: No  FALLS:  Has patient fallen in last 6 months? No  LIVING ENVIRONMENT: Lives with: lives with their family Lives in: House/apartment Stairs: Yes:  Internal: 13 steps; no issue with stairs Has following equipment at home: None  OCCUPATION: work from home; desk job  PLOF: Independent  PATIENT GOALS: decrease pain   OBJECTIVE:   DIAGNOSTIC FINDINGS:   CT 09/08/19 IMPRESSION: 1. Query left side pain, and if so consider Acute Diverticulitis of the distal colon which may be partially visible on series 3, image 104. Follow-up CT Abdomen and Pelvis with oral and IV contrast would best evaluate further.  2. Postoperative changes at L4-L5 with developing interbody arthrodesis suspected and no adverse features.   3. Stable since 2019 adjacent L3-L4 and L5-S1 degeneration. Mild bilateral L3 foraminal stenosis and chronically degenerated left L5-S1 assimilation joint.  PATIENT SURVEYS:  Modified Oswestry 12 / 50 = 24.0 %   SCREENING FOR RED FLAGS: negative  COGNITION: Overall cognitive status: Within functional limits for tasks assessed     SENSATION: WFL  MUSCLE LENGTH: HS: bil tightness Quads: WNL ITB: Piriformis:marked bil Hip Flexors: WNL Heelcords:   POSTURE: rounded shoulders, forward head, increased lumbar lordosis, and anterior pelvic tilt  PALPATION: L L5 TTP, Left gluteals espcially near PSIS No pain with PA mobs lumbar spine; step off at L3/4  LUMBAR ROM: WNL pain in L low back with R SB, stretch with R SB  LOWER EXTREMITY ROM:   WFL  LOWER EXTREMITY MMT:   Grossly 5/5, but notable core weakness L>R with MMT in sitting and prone.   TODAY'S TREATMENT:                                                                                                                              DATE:    11/24/22 THERAPEUTIC EXERCISE: to improve flexibility, strength and mobility.  Verbal and tactile cues throughout for technique. RecBike L4 0000000 Recert   [id number] THERAPEUTIC EXERCISE: to improve flexibility, strength and mobility.  Verbal and tactile cues throughout for technique. RecBike L4 x39mn Seated sciatic  nerve glide L x 10 Supine decompression - leg lengthener x 5 5 sec hold Supine sciatic nerve glide x 5 L Alt supine march with TA contraction x 5 each side 90/90 heel taps for TA x 10 ea, also did a few reps of full leg ext and dying bug to show progressions. Prone over pillow pelvic press plus alt hip ext x 10 Prone alt opp arm/leg x 3  Quadriped alt leg x 5 ea, then arm/leg x 5 ea Childs pose stretch x 30 sec each side   MANUAL THERAPY: To promote normalized muscle tension, improved flexibility, improved joint mobility, increased ROM, and reduced pain. Skilled palpation and monitoring of soft tissue during DN Trigger Point Dry-Needling  Treatment instructions: Expect mild to moderate muscle soreness. Patient verbalized understanding of these instructions and education. Patient Consent Given: Yes Education handout provided: Previously provided Muscles treated: L Glute Med along iliac crest, left L5 multifidi Electrical stimulation performed: No Parameters: N/A Treatment response/outcome: Twitch Response Elicited and Palpable Increase in Muscle Length   11/16/22 THERAPEUTIC EXERCISE: to improve flexibility, strength and mobility.  Verbal and tactile cues throughout for technique. RecBike L3 x651m Seated lumbar flexion stretch w/ green pball fwd and R side bend x 30 sec Childs pose stretch x 30 sec each side LTR 3x10" hold SKTC 2x30" Supine PPT 10x5" SLR in supine 10x5" hold with TrA bil Bridge with ball squeeze 10x5'  MANUAL THERAPY: To promote normalized muscle tension, improved flexibility, improved joint mobility,  increased ROM, and reduced pain.  STM to L QL closer to iliac crest   PATIENT EDUCATION:  Education details: HEP update Person educated: Patient Education method: Explanation, Demonstration, and Handouts Education comprehension: verbalized understanding and returned demonstration  HOME EXERCISE PROGRAM: Access Code: [email or web address] URL:  [email or web address] Date: 11/19/2022 Prepared by: Almyra Free  Exercises - Supine Hamstring Stretch with Strap  - 2 x daily - 7 x weekly - 2 sets - 3 reps - 30 sec hold - Supine Piriformis Stretch  - 2 x daily - 7 x weekly - 1 sets - 3 reps - 30-60 sec hold - Standing Quadratus Lumborum Stretch with Doorway  - 2 x daily - 7 x weekly - 1 sets - 2 reps - 60 sec hold - Sidelying Thoracic Rotation with Open Book  - 1 x daily - 7 x weekly - 2 sets - 10 reps - 5 second hold hold - Supine Lower Trunk Rotation  - 1 x daily - 7 x weekly - 10 reps - 5 seconds  hold - Child's Pose Stretch  - 1 x daily - 7 x weekly - 2 sets - 30 seconds hold - Child's Pose with Sidebending  - 1 x daily - 7 x weekly - 2 sets - 30 seconds hold - Supine Active Straight Leg Raise  - 1 x daily - 7 x weekly - 2 sets - 10 reps - 5 sec hold - Supine Posterior Pelvic Tilt  - 1 x daily - 7 x weekly - 2 sets - 10 reps - 5 sec hold - Supine Bridge with Mini Swiss Ball Between Knees  - 1 x daily - 7 x weekly - 2 sets - 10 reps - 5 sec hold - Supine Sciatic Nerve Glide  - 2 x daily - 7 x weekly - 1 sets - 10 reps - 5 sec hold - Seated Sciatic Tensioner  - 2 x daily - 7 x weekly - 1 sets - 10 reps - 5 second hold - Bird Dog  - 1 x daily - 7 x weekly - 1-3 sets - 10 reps - 5 sec hold - Supine 90/90 leg extensions  - 1 x daily - 7 x weekly - 1-3 sets - 10 reps - 3 sed hold  ASSESSMENT:  CLINICAL IMPRESSION:  ***  OBJECTIVE IMPAIRMENTS: decreased activity tolerance, decreased strength, increased muscle spasms, impaired flexibility, postural dysfunction, and pain.   ACTIVITY LIMITATIONS: standing and locomotion level  PARTICIPATION LIMITATIONS:  when pain present  PERSONAL FACTORS: Time since onset of injury/illness/exacerbation and 1-2 comorbidities: anxiety, L4/5 fusion  are also affecting patient's functional outcome.   REHAB POTENTIAL: Excellent  CLINICAL DECISION MAKING: Stable/uncomplicated  EVALUATION  COMPLEXITY: Low   GOALS: Goals reviewed with patient? Yes  SHORT TERM GOALS: Target date: 11/11/2022 (Remove Blue Hyperlink)  Patient will be independent with initial HEP.  Baseline:  Goal status: MET- 11/16/22  LONG TERM GOALS: Target date: 11/25/2022  (Remove Blue Hyperlink)  Patient will be independent with advanced/ongoing HEP to improve outcomes and carryover.  Baseline:  Goal status: IN PROGRESS  2.  Patient will report 75% improvement in low back pain with standing ADLS to improve QOL.  Baseline:  Goal status: IN PROGRESS  3.   Patient will demonstrate improved functional core strength as demonstrated by ability to stabilize with seated and prone MMT of LE. Baseline:  Goal status: IN PROGRESS  4.  Patient will report TBD on lumbar FOTO to demonstrate improved functional ability.  Baseline: TBD Goal status: IN PROGRESS   5.  Patient to demonstrate ability to achieve and maintain good spinal alignment/posturing and body mechanics needed for daily activities. Baseline: increased lordosis and ant pelvic tilt Goal status: IN PROGRESS  6.  Improved Mod Oswestry to <= 12% to demonstrate improved function Baseline: 12 / 50 = 24.0 % Goal status: IN PROGRESS  PLAN:  PT FREQUENCY: 2x/week  PT DURATION: 4 weeks  PLANNED INTERVENTIONS: Therapeutic exercises, Therapeutic activity, Neuromuscular re-education, Patient/Family education, Self Care, Joint mobilization, Dry Needling, Electrical stimulation, Spinal mobilization, Cryotherapy, Moist heat, Taping, and Manual therapy.  PLAN FOR NEXT SESSION:  focus on core strengthening, avoid extension exercises   Teea Ducey, PT 11/19/2022, 3:22 PM

## 2022-11-25 ENCOUNTER — Ambulatory Visit: Payer: 59

## 2022-11-25 DIAGNOSIS — M5459 Other low back pain: Secondary | ICD-10-CM

## 2022-11-25 DIAGNOSIS — M62838 Other muscle spasm: Secondary | ICD-10-CM

## 2022-11-25 DIAGNOSIS — M6281 Muscle weakness (generalized): Secondary | ICD-10-CM

## 2022-11-25 NOTE — Addendum Note (Signed)
Addended by: Jaye Beagle on: 11/25/2022 01:33 PM   Modules accepted: Orders

## 2022-11-25 NOTE — Therapy (Addendum)
OUTPATIENT PHYSICAL THERAPY TREATMENT AND PROGRESS NOTE  Progress Note Reporting Period 10/28/22 to 11/25/22  See note below for Objective Data and Assessment of Progress/Goals.     Patient Name: Albert Skinner MRN: [id number] DOB:[birth year], 42 y.o., male Today's Date: 11/25/2022  END OF SESSION:  PT End of Session - 11/25/22 1022     Visit Number 5    Number of Visits 17    Date for PT Re-Evaluation 01/06/23    Authorization Type [employer]    PT Start Time 0934    PT Stop Time 1015    PT Time Calculation (min) 41 min    Activity Tolerance Patient tolerated treatment well    Behavior During Therapy WFL for tasks assessed/performed                Past Medical History:  Diagnosis Date   Anxiety    Fatty liver    Hypertension    Sleep apnea    uses CPAP   Past Surgical History:  Procedure Laterality Date   WISDOM TOOTH EXTRACTION     Patient Active Problem List   Diagnosis Date Noted   Spondylolisthesis at L4-L5 level 06/10/2017    PCP: Patient, No Pcp Per  REFERRING PROVIDER: Kary Kos, MD  REFERRING DIAG: M43.16 (ICD-10-CM) - Spondylolisthesis, lumbar region   RATIONALE FOR EVALUATION AND TREATMENT: Rehabilitation  THERAPY DIAG:  Other low back pain  Muscle weakness (generalized)  Other muscle spasm  ONSET DATE: 2021  NEXT MD VISIT: 11/12/22   SUBJECTIVE:                                                                                                                                                                                           SUBJECTIVE STATEMENT:  Pt reports on Sunday went out with his daughter, walked for 2 hours (longer than he normally does), later on pain was 8-9/10. Feels like DN and STM really helps but he constantly feels that one spot (L QL).    PERTINENT HISTORY:  Anxiety, HTN, L4/5 fusion  PAIN:  Are you having pain? Yes: NPRS scale: 1-2/10 Pain location: left low back Pain description: constant annoying Aggravating  factors: EOD Relieving factors: Sitting with left leg extended   PRECAUTIONS: None  WEIGHT BEARING RESTRICTIONS: No  FALLS:  Has patient fallen in last 6 months? No  LIVING ENVIRONMENT: Lives with: lives with their family Lives in: House/apartment Stairs: Yes: Internal: 13 steps; no issue with stairs Has following equipment at home: None  OCCUPATION: work from home; desk job  PLOF: Independent  PATIENT GOALS: decrease pain   OBJECTIVE:   DIAGNOSTIC FINDINGS:  CT 09/08/19 IMPRESSION: 1. Query left side pain, and if so consider Acute Diverticulitis of the distal colon which may be partially visible on series 3, image 104. Follow-up CT Abdomen and Pelvis with oral and IV contrast would best evaluate further.   2. Postoperative changes at L4-L5 with developing interbody arthrodesis suspected and no adverse features.   3. Stable since 2019 adjacent L3-L4 and L5-S1 degeneration. Mild bilateral L3 foraminal stenosis and chronically degenerated left L5-S1 assimilation joint.  PATIENT SURVEYS:  Modified Oswestry 12 / 50 = 24.0 %   SCREENING FOR RED FLAGS: negative  COGNITION: Overall cognitive status: Within functional limits for tasks assessed     SENSATION: WFL  MUSCLE LENGTH: HS: bil tightness Quads: WNL ITB: Piriformis:marked bil Hip Flexors: WNL Heelcords:   POSTURE: rounded shoulders, forward head, increased lumbar lordosis, and anterior pelvic tilt  PALPATION: L L5 TTP, Left gluteals espcially near PSIS No pain with PA mobs lumbar spine; step off at L3/4  LUMBAR ROM: WNL pain in L low back with R SB, stretch with R SB  LOWER EXTREMITY ROM:   WFL  LOWER EXTREMITY MMT:   Grossly 5/5, but notable core weakness L>R with MMT in sitting and prone.   TODAY'S TREATMENT:                                                                                                                              DATE:   11/25/22 Rec Bike L2x 24mn Modified Oswestry Low  Back Pain Disability Questionnaire: 8 / 50 = 16.0 % FOTO: Lumbar 56.3% LE MMT- 5/5 hip ABD, ext, flexors Standing pallof press 10# 3x10 each side  11/19/22 THERAPEUTIC EXERCISE: to improve flexibility, strength and mobility.  Verbal and tactile cues throughout for technique. RecBike L4 x68m Seated sciatic nerve glide L x 10 Supine decompression - leg lengthener x 5 5 sec hold Supine sciatic nerve glide x 5 L Alt supine march with TA contraction x 5 each side 90/90 heel taps for TA x 10 ea, also did a few reps of full leg ext and dying bug to show progressions. Prone over pillow pelvic press plus alt hip ext x 10 Prone alt opp arm/leg x 3  Quadriped alt leg x 5 ea, then arm/leg x 5 ea Childs pose stretch x 30 sec each side   MANUAL THERAPY: To promote normalized muscle tension, improved flexibility, improved joint mobility, increased ROM, and reduced pain. Skilled palpation and monitoring of soft tissue during DN Trigger Point Dry-Needling  Treatment instructions: Expect mild to moderate muscle soreness. Patient verbalized understanding of these instructions and education. Patient Consent Given: Yes Education handout provided: Previously provided Muscles treated: L Glute Med along iliac crest, left L5 multifidi Electrical stimulation performed: No Parameters: N/A Treatment response/outcome: Twitch Response Elicited and Palpable Increase in Muscle Length   11/16/22 THERAPEUTIC EXERCISE: to improve flexibility, strength and mobility.  Verbal and tactile cues throughout for technique. RecBike L3 x6m69mSeated  lumbar flexion stretch w/ green pball fwd and R side bend x 30 sec Childs pose stretch x 30 sec each side LTR 3x10" hold SKTC 2x30" Supine PPT 10x5" SLR in supine 10x5" hold with TrA bil Bridge with ball squeeze 10x5'  MANUAL THERAPY: To promote normalized muscle tension, improved flexibility, improved joint mobility, increased ROM, and reduced pain.  STM to L QL closer to  iliac crest   11/10/22 THERAPEUTIC EXERCISE: to improve flexibility, strength and mobility.  Verbal and tactile cues throughout for technique. RecBike L3 x52mn  Supine Hamstring Stretch with Strap3x30"  Seated Piriformis Stretch 2x30"  SKTC 3x20"  LTR x10 each 5"hold  Open Book x10 each 5" hold Child's Pose Stx 3-Way 2x30"  MANUAL THERAPY: To promote normalized muscle tension, improved flexibility, improved joint mobility, increased ROM, and reduced pain. Skilled palpation and monitoring of soft tissue during DN Trigger Point Dry-Needling  Treatment instructions: Expect mild to moderate muscle soreness. Patient verbalized understanding of these instructions and education. Patient Consent Given: Yes Education handout provided: Previously provided Muscles treated: L Glute Med/Min, L QL, L Erector Spinae  Electrical stimulation performed: No Parameters: N/A Treatment response/outcome: Twitch Response Elicited and Palpable Increase in Muscle Length STM/DTM, manual TPR and pin & stretch to muscles addressed with DN   10/28/22  Initial HEP Manual Therapy: to decrease muscle spasm and pain and improve mobility  Skilled palpation and monitoring of soft tissues during DN  STM to left gluteals Trigger Point Dry-Needling  Treatment instructions: Expect mild to moderate muscle soreness. S/S of pneumothorax if dry needled over a lung field, and to seek immediate medical attention should they occur. Patient verbalized understanding of these instructions and education. Patient Consent Given: Yes Education handout provided: Yes Muscles treated: Left gluteals, lumbar multifidi Electrical stimulation performed: No Parameters: N/A Treatment response/outcome: Twitch Response Elicited and Palpable Increase in Muscle Length   PATIENT EDUCATION:  Education details: HEP update Person educated: Patient Education method: Explanation, Demonstration, and Handouts Education comprehension: verbalized  understanding and returned demonstration  HOME EXERCISE PROGRAM: Access Code: [email or web address] URL: [email or web address] Date: 11/19/2022 Prepared by: JAlmyra Free Exercises - Supine Hamstring Stretch with Strap  - 2 x daily - 7 x weekly - 2 sets - 3 reps - 30 sec hold - Supine Piriformis Stretch  - 2 x daily - 7 x weekly - 1 sets - 3 reps - 30-60 sec hold - Standing Quadratus Lumborum Stretch with Doorway  - 2 x daily - 7 x weekly - 1 sets - 2 reps - 60 sec hold - Sidelying Thoracic Rotation with Open Book  - 1 x daily - 7 x weekly - 2 sets - 10 reps - 5 second hold hold - Supine Lower Trunk Rotation  - 1 x daily - 7 x weekly - 10 reps - 5 seconds  hold - Child's Pose Stretch  - 1 x daily - 7 x weekly - 2 sets - 30 seconds hold - Child's Pose with Sidebending  - 1 x daily - 7 x weekly - 2 sets - 30 seconds hold - Supine Active Straight Leg Raise  - 1 x daily - 7 x weekly - 2 sets - 10 reps - 5 sec hold - Supine Posterior Pelvic Tilt  - 1 x daily - 7 x weekly - 2 sets - 10 reps - 5 sec hold - Supine Bridge with Mini Swiss Ball Between Knees  - 1 x daily - 7 x weekly - 2 sets - 10  reps - 5 sec hold - Supine Sciatic Nerve Glide  - 2 x daily - 7 x weekly - 1 sets - 10 reps - 5 sec hold - Seated Sciatic Tensioner  - 2 x daily - 7 x weekly - 1 sets - 10 reps - 5 second hold - Bird Dog  - 1 x daily - 7 x weekly - 1-3 sets - 10 reps - 5 sec hold - Supine 90/90 leg extensions  - 1 x daily - 7 x weekly - 1-3 sets - 10 reps - 3 sed hold  ASSESSMENT:  CLINICAL IMPRESSION:  Pt notes that he gets increased low back pain after walking for long periods of time. He shows improvement in Mod Oswestry at 16%. He scored 56.3% on FOTO, unable to see predicted D/C level d/t [place] being down. HE showed no issues with being able to stabilize with LE MMT and scored 5/5 in all muscles tested. He only reports 15% improvement in LBP with ADLs. He has met LTG#3 and progressing toward all other goals. Based on  subjective reports and performance would recommend additional 6 weeks of PT with frequency of 2x per week to address remaining impairments.  OBJECTIVE IMPAIRMENTS: decreased activity tolerance, decreased strength, increased muscle spasms, impaired flexibility, postural dysfunction, and pain.   ACTIVITY LIMITATIONS: standing and locomotion level  PARTICIPATION LIMITATIONS:  when pain present  PERSONAL FACTORS: Time since onset of injury/illness/exacerbation and 1-2 comorbidities: anxiety, L4/5 fusion  are also affecting patient's functional outcome.   REHAB POTENTIAL: Excellent  CLINICAL DECISION MAKING: Stable/uncomplicated  EVALUATION COMPLEXITY: Low   GOALS: Goals reviewed with patient? Yes  SHORT TERM GOALS: Target date: 11/11/2022 (Remove Blue Hyperlink)  Patient will be independent with initial HEP.  Baseline:  Goal status: MET- 11/16/22  LONG TERM GOALS: Target date: 11/25/2022 ext to 01/06/23 (Remove Blue Hyperlink)  Patient will be independent with advanced/ongoing HEP to improve outcomes and carryover.  Baseline:  Goal status: IN PROGRESS  2.  Patient will report 75% improvement in low back pain with standing ADLS to improve QOL.  Baseline:  Goal status: IN PROGRESS - 11/25/22 15% improvement  3.   Patient will demonstrate improved functional core strength as demonstrated by ability to stabilize with seated and prone MMT of LE. Baseline:  Goal status: MET  4.  Patient will report TBD on lumbar FOTO to demonstrate improved functional ability.  Baseline: TBD Goal status: IN PROGRESS - 11/25/22 56.3%  5.  Patient to demonstrate ability to achieve and maintain good spinal alignment/posturing and body mechanics needed for daily activities. Baseline: increased lordosis and ant pelvic tilt Goal status: IN PROGRESS  6.  Improved Mod Oswestry to <= 12% to demonstrate improved function Baseline: 12 / 50 = 24.0 % Goal status: IN PROGRESS - 11/25/22 16%  PLAN:  PT  FREQUENCY: 2x/week  PT DURATION: 4 weeks  PLANNED INTERVENTIONS: Therapeutic exercises, Therapeutic activity, Neuromuscular re-education, Patient/Family education, Self Care, Joint mobilization, Dry Needling, Electrical stimulation, Spinal mobilization, Cryotherapy, Moist heat, Taping, and Manual therapy.  PLAN FOR NEXT SESSION:  focus on core strengthening, avoid extension exercises   Artist Pais, PTA 11/25/2022, 10:23 AM

## 2022-11-27 ENCOUNTER — Ambulatory Visit: Payer: 59 | Attending: Neurosurgery | Admitting: Physical Therapy

## 2022-11-27 ENCOUNTER — Encounter: Payer: Self-pay | Admitting: Physical Therapy

## 2022-11-27 DIAGNOSIS — M5459 Other low back pain: Secondary | ICD-10-CM | POA: Insufficient documentation

## 2022-11-27 DIAGNOSIS — M62838 Other muscle spasm: Secondary | ICD-10-CM | POA: Diagnosis present

## 2022-11-27 DIAGNOSIS — M6281 Muscle weakness (generalized): Secondary | ICD-10-CM | POA: Diagnosis present

## 2022-11-27 NOTE — Therapy (Signed)
OUTPATIENT PHYSICAL THERAPY TREATMENT    Patient Name: Albert Skinner MRN: [id number] DOB:Jan 06, 1981, 42 y.o., male Today's Date: 11/27/2022  END OF SESSION:  PT End of Session - 11/27/22 0846     Visit Number 6    Number of Visits 17    Date for PT Re-Evaluation 01/06/23    Authorization Type [employer]    PT Start Time 0846    PT Stop Time 0928    PT Time Calculation (min) 42 min    Activity Tolerance Patient tolerated treatment well    Behavior During Therapy WFL for tasks assessed/performed                Past Medical History:  Diagnosis Date   Anxiety    Fatty liver    Hypertension    Sleep apnea    uses CPAP   Past Surgical History:  Procedure Laterality Date   WISDOM TOOTH EXTRACTION     Patient Active Problem List   Diagnosis Date Noted   Spondylolisthesis at L4-L5 level 06/10/2017    PCP:  No PCP Per Patient  REFERRING PROVIDER: Kary Kos, MD  REFERRING DIAG: M43.16 (ICD-10-CM) - Spondylolisthesis, lumbar region   RATIONALE FOR EVALUATION AND TREATMENT: Rehabilitation  THERAPY DIAG:  Other low back pain  Muscle weakness (generalized)  Other muscle spasm  ONSET DATE: 2021  NEXT MD VISIT: TBD   SUBJECTIVE:                                                                                                                                                                                           SUBJECTIVE STATEMENT:  Pt reports he went for a ~1.5 mile walk yesterday leaving him with increased tightness in his back and weakness in his legs (difficulty lifting his leg to step up on a curb) but no numbness/tingling.  PAIN:  Are you having pain? Yes: NPRS scale:  1/10 Pain location: left low back Pain description: pressure more than pain Aggravating factors: EOD Relieving factors: Sitting with left leg extended    PERTINENT HISTORY:  Anxiety, HTN, L4/5 fusion  PRECAUTIONS: None  WEIGHT BEARING RESTRICTIONS: No  FALLS:  Has patient fallen  in last 6 months? No  LIVING ENVIRONMENT: Lives with: lives with their family Lives in: House/apartment Stairs: Yes: Internal: 13 steps; no issue with stairs Has following equipment at home: None  OCCUPATION: work from home; desk job  PLOF: Independent  PATIENT GOALS: decrease pain   OBJECTIVE:   DIAGNOSTIC FINDINGS:   CT 09/08/19 IMPRESSION: 1. Query left side pain, and if so consider Acute Diverticulitis of the distal colon which may be  partially visible on series 3, image 104. Follow-up CT Abdomen and Pelvis with oral and IV contrast would best evaluate further.   2. Postoperative changes at L4-L5 with developing interbody arthrodesis suspected and no adverse features.   3. Stable since 2019 adjacent L3-L4 and L5-S1 degeneration. Mild bilateral L3 foraminal stenosis and chronically degenerated left L5-S1 assimilation joint.  PATIENT SURVEYS:  Modified Oswestry 12 / 50 = 24.0 %   SCREENING FOR RED FLAGS: negative  COGNITION: Overall cognitive status: Within functional limits for tasks assessed     SENSATION: WFL  MUSCLE LENGTH: HS: bil tightness Quads: WNL ITB: Piriformis:marked bil Hip Flexors: WNL Heelcords:  POSTURE: rounded shoulders, forward head, increased lumbar lordosis, and anterior pelvic tilt  PALPATION: L L5 TTP, Left gluteals espcially near PSIS No pain with PA mobs lumbar spine; step off at L3/4  LUMBAR ROM: WNL pain in L low back with R SB, stretch with R SB  LOWER EXTREMITY ROM:   WFL  LOWER EXTREMITY MMT:   Grossly 5/5, but notable core weakness L>R with MMT in sitting and prone.   TODAY'S TREATMENT:                                                                                                                              DATE:    11/27/22 THERAPEUTIC EXERCISE: to improve flexibility, strength and mobility.  Verbal and tactile cues throughout for technique.  Rec Bike - L4 x 6 min Seated L/R hip hinge HS stretch 2 x 30" - 2nd rep  with foot elevated on trash can laying on its side Seated L/R figure-4 piriformis stretch with hip hinge + slight overpressure 2 x 30" Seated L/R KTOS piriformis stretch 2 x 30" Seated 3-way lumbar flexion stretch with hands on rolling stool x 30" each Standing hip abduction with looped GTB at ankles x 10 bil Standing hip extension with looped GTB at ankles x 10 bil Standing hip flexion march with looped GTB at mid feet x 10 bil  MANUAL THERAPY: To promote normalized muscle tension, improved flexibility, increased ROM, and reduced pain. Skilled palpation and monitoring of soft tissue during DN Trigger Point Dry-Needling  Treatment instructions: Expect mild to moderate muscle soreness. Patient verbalized understanding of these instructions and education. Patient Consent Given: Yes Education handout provided: Previously provided Muscles treated: L glute medius & minimus, medial & lateral piriformis Electrical stimulation performed: No Parameters: N/A Treatment response/outcome: Twitch Response Elicited and Palpable Increase in Muscle Length STM/DTM, manual TPR and pin & stretch to muscles addressed with DN   11/25/22 Rec Bike L2x 33mn Modified Oswestry Low Back Pain Disability Questionnaire: 8 / 50 = 16.0 % FOTO: Lumbar 56.3% LE MMT- 5/5 hip ABD, ext, flexors Standing pallof press 10# 3x10 each side   11/19/22 THERAPEUTIC EXERCISE: to improve flexibility, strength and mobility.  Verbal and tactile cues throughout for technique. RecBike L4 x684m Seated sciatic nerve glide L x 10 Supine decompression -  leg lengthener x 5 5 sec hold Supine sciatic nerve glide x 5 L Alt supine march with TA contraction x 5 each side 90/90 heel taps for TA x 10 ea, also did a few reps of full leg ext and dying bug to show progressions. Prone over pillow pelvic press plus alt hip ext x 10 Prone alt opp arm/leg x 3  Quadriped alt leg x 5 ea, then arm/leg x 5 ea Childs pose stretch x 30 sec each  side  MANUAL THERAPY: To promote normalized muscle tension, improved flexibility, improved joint mobility, increased ROM, and reduced pain. Skilled palpation and monitoring of soft tissue during DN Trigger Point Dry-Needling  Treatment instructions: Expect mild to moderate muscle soreness. Patient verbalized understanding of these instructions and education. Patient Consent Given: Yes Education handout provided: Previously provided Muscles treated: L Glute Med along iliac crest, left L5 multifidi Electrical stimulation performed: No Parameters: N/A Treatment response/outcome: Twitch Response Elicited and Palpable Increase in Muscle Length   PATIENT EDUCATION:  Education details: HEP update Person educated: Patient Education method: Explanation, Demonstration, and Handouts Education comprehension: verbalized understanding and returned demonstration  HOME EXERCISE PROGRAM: Access Code: [email or web address] URL: [email or web address] Date: 11/27/2022 Prepared by: Annie Paras  Exercises - Supine Hamstring Stretch with Strap  - 2 x daily - 7 x weekly - 2 sets - 3 reps - 30 sec hold - Supine Piriformis Stretch  - 2 x daily - 7 x weekly - 1 sets - 3 reps - 30-60 sec hold - Standing Quadratus Lumborum Stretch with Doorway  - 2 x daily - 7 x weekly - 1 sets - 2 reps - 60 sec hold - Sidelying Thoracic Rotation with Open Book  - 1 x daily - 7 x weekly - 2 sets - 10 reps - 5 second hold hold - Supine Lower Trunk Rotation  - 1 x daily - 7 x weekly - 10 reps - 5 seconds  hold - Child's Pose Stretch  - 1 x daily - 7 x weekly - 2 sets - 30 seconds hold - Child's Pose with Sidebending  - 1 x daily - 7 x weekly - 2 sets - 30 seconds hold - Supine Active Straight Leg Raise  - 1 x daily - 7 x weekly - 2 sets - 10 reps - 5 sec hold - Supine Posterior Pelvic Tilt  - 1 x daily - 7 x weekly - 2 sets - 10 reps - 5 sec hold - Supine Bridge with Mini Swiss Ball Between Knees  - 1 x daily - 7 x weekly - 2  sets - 10 reps - 5 sec hold - Supine Sciatic Nerve Glide  - 2 x daily - 7 x weekly - 1 sets - 10 reps - 5 sec hold - Seated Sciatic Tensioner  - 2 x daily - 7 x weekly - 1 sets - 10 reps - 5 second hold - Bird Dog  - 1 x daily - 7 x weekly - 1-3 sets - 10 reps - 5 sec hold - Supine 90/90 leg extensions  - 1 x daily - 7 x weekly - 1-3 sets - 10 reps - 3 sed hold - Hip Abduction with Resistance Loop  - 1 x daily - 3-4 x weekly - 2 sets - 10 reps - 3 sec hold - Hip Extension with Resistance Loop  - 1 x daily - 3-4 x weekly - 2 sets - 10 reps - 3 sec hold - Marching with Resistance  -  1 x daily - 3-4 x weekly - 2 sets - 10 reps - 3 sec hold  ASSESSMENT:  CLINICAL IMPRESSION:  Massi reports increased muscle tension and LE weakness after walking ~1.5 miles yesterday. He has not tried stretching before walking but will sometimes stretch afterwards, although not yesterday. Discussed benefit of stretching before and after exercise including walking and provided instruction in seated alternatives for stretches as he typically takes his walks while on a break at the office (pt declined HEP instructions for these). He also notes feeling of progressive "weakness" in his legs while walking, notable when he tries to step up on a curb, therefore targeted hip strengthening in standing. He continues to note benefit from DN, therefore this was utilized to address some of the ongoing tightness and increased muscle tension with good twitch responses elicited resulting in palpable reduction in muscle tension/tightness. Irene will continue to benefit from skilled PT to address ongoing muscle tension/flexibility and strength deficits to improve mobility and activity tolerance with decreased pain interference.   OBJECTIVE IMPAIRMENTS: decreased activity tolerance, decreased strength, increased muscle spasms, impaired flexibility, postural dysfunction, and pain.   ACTIVITY LIMITATIONS: standing and locomotion  level  PARTICIPATION LIMITATIONS:  when pain present  PERSONAL FACTORS: Time since onset of injury/illness/exacerbation and 1-2 comorbidities: anxiety, L4/5 fusion  are also affecting patient's functional outcome.   REHAB POTENTIAL: Excellent  CLINICAL DECISION MAKING: Stable/uncomplicated  EVALUATION COMPLEXITY: Low   GOALS: Goals reviewed with patient? Yes  SHORT TERM GOALS: Target date: 11/11/2022   Patient will be independent with initial HEP.  Baseline:  Goal status: MET- 11/16/22  LONG TERM GOALS: Target date: 11/25/2022, extended to 01/06/2023   Patient will be independent with advanced/ongoing HEP to improve outcomes and carryover.  Baseline:  Goal status: IN PROGRESS  2.  Patient will report 75% improvement in low back pain with standing ADLS to improve QOL.  Baseline:  Goal status: IN PROGRESS - 11/25/22 15% improvement  3.   Patient will demonstrate improved functional core strength as demonstrated by ability to stabilize with seated and prone MMT of LE. Baseline:  Goal status: MET  4.  Patient will report TBD on lumbar FOTO to demonstrate improved functional ability.  Baseline: TBD Goal status: IN PROGRESS - 11/25/22 56.3%  5.  Patient to demonstrate ability to achieve and maintain good spinal alignment/posturing and body mechanics needed for daily activities. Baseline: increased lordosis and ant pelvic tilt Goal status: IN PROGRESS  6.  Improved Mod Oswestry to <= 12% to demonstrate improved function Baseline: 12 / 50 = 24.0 % Goal status: IN PROGRESS - 11/25/22 16%  PLAN:  PT FREQUENCY: 2x/week  PT DURATION: 4 weeks  PLANNED INTERVENTIONS: Therapeutic exercises, Therapeutic activity, Neuromuscular re-education, Patient/Family education, Self Care, Joint mobilization, Dry Needling, Electrical stimulation, Spinal mobilization, Cryotherapy, Moist heat, Taping, and Manual therapy.  PLAN FOR NEXT SESSION:  focus on core strengthening, avoid extension  exercises   Percival Spanish, PT 11/27/2022, 9:43 AM

## 2022-11-30 ENCOUNTER — Ambulatory Visit: Payer: 59

## 2022-11-30 DIAGNOSIS — M5459 Other low back pain: Secondary | ICD-10-CM

## 2022-11-30 DIAGNOSIS — M6281 Muscle weakness (generalized): Secondary | ICD-10-CM

## 2022-11-30 DIAGNOSIS — M62838 Other muscle spasm: Secondary | ICD-10-CM

## 2022-11-30 NOTE — Therapy (Signed)
OUTPATIENT PHYSICAL THERAPY TREATMENT    Patient Name: Albert Skinner MRN: [id number] DOB:[birth year], 42 y.o., male Today's Date: 11/30/2022  END OF SESSION:  PT End of Session - 11/30/22 0918     Visit Number 7    Number of Visits 17    Date for PT Re-Evaluation 01/06/23    Authorization Type [employer]    PT Start Time 0850   pt late   PT Stop Time 0928    PT Time Calculation (min) 38 min    Activity Tolerance Patient tolerated treatment well    Behavior During Therapy [facility] for tasks assessed/performed                 Past Medical History:  Diagnosis Date   Anxiety    Fatty liver    Hypertension    Sleep apnea    uses CPAP   Past Surgical History:  Procedure Laterality Date   WISDOM TOOTH EXTRACTION     Patient Active Problem List   Diagnosis Date Noted   Spondylolisthesis at L4-L5 level 06/10/2017    PCP:  No PCP Per Patient  REFERRING PROVIDER: Kary Kos, MD  REFERRING DIAG: M43.16 (ICD-10-CM) - Spondylolisthesis, lumbar region   RATIONALE FOR EVALUATION AND TREATMENT: Rehabilitation  THERAPY DIAG:  Other low back pain  Muscle weakness (generalized)  Other muscle spasm  ONSET DATE: 2021  NEXT MD VISIT: TBD   SUBJECTIVE:                                                                                                                                                                                           SUBJECTIVE STATEMENT:  Pt reports his back is sore. The DN helped from last visit.  PAIN:  Are you having pain? Yes: NPRS scale:  1/10 Pain location: left low back Pain description: pressure more than pain Aggravating factors: EOD Relieving factors: Sitting with left leg extended    PERTINENT HISTORY:  Anxiety, HTN, L4/5 fusion  PRECAUTIONS: None  WEIGHT BEARING RESTRICTIONS: No  FALLS:  Has patient fallen in last 6 months? No  LIVING ENVIRONMENT: Lives with: lives with their family Lives in: House/apartment Stairs: Yes:  Internal: 13 steps; no issue with stairs Has following equipment at home: None  OCCUPATION: work from home; desk job  PLOF: Independent  PATIENT GOALS: decrease pain   OBJECTIVE:   DIAGNOSTIC FINDINGS:   CT 09/08/19 IMPRESSION: 1. Query left side pain, and if so consider Acute Diverticulitis of the distal colon which may be partially visible on series 3, image 104. Follow-up CT Abdomen and Pelvis with oral and IV contrast would best evaluate  further.   2. Postoperative changes at L4-L5 with developing interbody arthrodesis suspected and no adverse features.   3. Stable since 2019 adjacent L3-L4 and L5-S1 degeneration. Mild bilateral L3 foraminal stenosis and chronically degenerated left L5-S1 assimilation joint.  PATIENT SURVEYS:  Modified Oswestry 12 / 50 = 24.0 %   SCREENING FOR RED FLAGS: negative  COGNITION: Overall cognitive status: Within functional limits for tasks assessed     SENSATION: WFL  MUSCLE LENGTH: HS: bil tightness Quads: WNL ITB: Piriformis:marked bil Hip Flexors: WNL Heelcords:  POSTURE: rounded shoulders, forward head, increased lumbar lordosis, and anterior pelvic tilt  PALPATION: L L5 TTP, Left gluteals espcially near PSIS No pain with PA mobs lumbar spine; step off at L3/4  LUMBAR ROM: WNL pain in L low back with R SB, stretch with R SB  LOWER EXTREMITY ROM:   WFL  LOWER EXTREMITY MMT:   Grossly 5/5, but notable core weakness L>R with MMT in sitting and prone.   TODAY'S TREATMENT:                                                                                                                              DATE:   11/30/22 THERAPEUTIC EXERCISE: to improve flexibility, strength and mobility.  Verbal and tactile cues throughout for technique.  Rec Bike - L4 x 6 min Supine KTOS 2x30 sec bil Supine QL stretch 2x30 sec bil Counter support: -Standing hip abduction x 10 bil GTB at ankles -Standing hip extension x 10 bil GTB at  ankles -Standing march GTB x 10 bil GTB at ankles  Manual Therapy: to decrease muscle spasm, pain and improve mobility.  STM to L QL and glutes 11/27/22 THERAPEUTIC EXERCISE: to improve flexibility, strength and mobility.  Verbal and tactile cues throughout for technique.  Rec Bike - L4 x 6 min Seated L/R hip hinge HS stretch 2 x 30" - 2nd rep with foot elevated on trash can laying on its side Seated L/R figure-4 piriformis stretch with hip hinge + slight overpressure 2 x 30" Seated L/R KTOS piriformis stretch 2 x 30" Seated 3-way lumbar flexion stretch with hands on rolling stool x 30" each Standing hip abduction with looped GTB at ankles x 10 bil Standing hip extension with looped GTB at ankles x 10 bil Standing hip flexion march with looped GTB at mid feet x 10 bil  MANUAL THERAPY: To promote normalized muscle tension, improved flexibility, increased ROM, and reduced pain. Skilled palpation and monitoring of soft tissue during DN Trigger Point Dry-Needling  Treatment instructions: Expect mild to moderate muscle soreness. Patient verbalized understanding of these instructions and education. Patient Consent Given: Yes Education handout provided: Previously provided Muscles treated: L glute medius & minimus, medial & lateral piriformis Electrical stimulation performed: No Parameters: N/A Treatment response/outcome: Twitch Response Elicited and Palpable Increase in Muscle Length STM/DTM, manual TPR and pin & stretch to muscles addressed with DN   11/25/22 Rec Bike L2x 37mn  Modified Oswestry Low Back Pain Disability Questionnaire: 8 / 50 = 16.0 % FOTO: Lumbar 56.3% LE MMT- 5/5 hip ABD, ext, flexors Standing pallof press 10# 3x10 each side   11/19/22 THERAPEUTIC EXERCISE: to improve flexibility, strength and mobility.  Verbal and tactile cues throughout for technique. RecBike L4 x63mn Seated sciatic nerve glide L x 10 Supine decompression - leg lengthener x 5 5 sec hold Supine  sciatic nerve glide x 5 L Alt supine march with TA contraction x 5 each side 90/90 heel taps for TA x 10 ea, also did a few reps of full leg ext and dying bug to show progressions. Prone over pillow pelvic press plus alt hip ext x 10 Prone alt opp arm/leg x 3  Quadriped alt leg x 5 ea, then arm/leg x 5 ea Childs pose stretch x 30 sec each side  MANUAL THERAPY: To promote normalized muscle tension, improved flexibility, improved joint mobility, increased ROM, and reduced pain. Skilled palpation and monitoring of soft tissue during DN Trigger Point Dry-Needling  Treatment instructions: Expect mild to moderate muscle soreness. Patient verbalized understanding of these instructions and education. Patient Consent Given: Yes Education handout provided: Previously provided Muscles treated: L Glute Med along iliac crest, left L5 multifidi Electrical stimulation performed: No Parameters: N/A Treatment response/outcome: Twitch Response Elicited and Palpable Increase in Muscle Length   PATIENT EDUCATION:  Education details: HEP update Person educated: Patient Education method: Explanation, Demonstration, and Handouts Education comprehension: verbalized understanding and returned demonstration  HOME EXERCISE PROGRAM: Access Code: [email or web address] URL: [email or web address] Date: 11/27/2022 Prepared by: JAnnie Paras Exercises - Supine Hamstring Stretch with Strap  - 2 x daily - 7 x weekly - 2 sets - 3 reps - 30 sec hold - Supine Piriformis Stretch  - 2 x daily - 7 x weekly - 1 sets - 3 reps - 30-60 sec hold - Standing Quadratus Lumborum Stretch with Doorway  - 2 x daily - 7 x weekly - 1 sets - 2 reps - 60 sec hold - Sidelying Thoracic Rotation with Open Book  - 1 x daily - 7 x weekly - 2 sets - 10 reps - 5 second hold hold - Supine Lower Trunk Rotation  - 1 x daily - 7 x weekly - 10 reps - 5 seconds  hold - Child's Pose Stretch  - 1 x daily - 7 x weekly - 2 sets - 30 seconds hold -  Child's Pose with Sidebending  - 1 x daily - 7 x weekly - 2 sets - 30 seconds hold - Supine Active Straight Leg Raise  - 1 x daily - 7 x weekly - 2 sets - 10 reps - 5 sec hold - Supine Posterior Pelvic Tilt  - 1 x daily - 7 x weekly - 2 sets - 10 reps - 5 sec hold - Supine Bridge with Mini Swiss Ball Between Knees  - 1 x daily - 7 x weekly - 2 sets - 10 reps - 5 sec hold - Supine Sciatic Nerve Glide  - 2 x daily - 7 x weekly - 1 sets - 10 reps - 5 sec hold - Seated Sciatic Tensioner  - 2 x daily - 7 x weekly - 1 sets - 10 reps - 5 second hold - Bird Dog  - 1 x daily - 7 x weekly - 1-3 sets - 10 reps - 5 sec hold - Supine 90/90 leg extensions  - 1 x daily - 7 x weekly - 1-3  sets - 10 reps - 3 sed hold - Hip Abduction with Resistance Loop  - 1 x daily - 3-4 x weekly - 2 sets - 10 reps - 3 sec hold - Hip Extension with Resistance Loop  - 1 x daily - 3-4 x weekly - 2 sets - 10 reps - 3 sec hold - Marching with Resistance  - 1 x daily - 3-4 x weekly - 2 sets - 10 reps - 3 sec hold  ASSESSMENT:  CLINICAL IMPRESSION:  Azi had continued reports of just feeling soreness/stiffness along his L QL this morning. STM done to L low back and glutes to decrease stiffness and pain. Followed with exercises to strengthen hips to improve standing tolerance. Pt with good response, would continue to benefit from DN, MT, and exercises to reduce LBP .  OBJECTIVE IMPAIRMENTS: decreased activity tolerance, decreased strength, increased muscle spasms, impaired flexibility, postural dysfunction, and pain.   ACTIVITY LIMITATIONS: standing and locomotion level  PARTICIPATION LIMITATIONS:  when pain present  PERSONAL FACTORS: Time since onset of injury/illness/exacerbation and 1-2 comorbidities: anxiety, L4/5 fusion  are also affecting patient's functional outcome.   REHAB POTENTIAL: Excellent  CLINICAL DECISION MAKING: Stable/uncomplicated  EVALUATION COMPLEXITY: Low   GOALS: Goals reviewed with patient?  Yes  SHORT TERM GOALS: Target date: 11/11/2022   Patient will be independent with initial HEP.  Baseline:  Goal status: MET- 11/16/22  LONG TERM GOALS: Target date: 11/25/2022, extended to 01/06/2023   Patient will be independent with advanced/ongoing HEP to improve outcomes and carryover.  Baseline:  Goal status: IN PROGRESS  2.  Patient will report 75% improvement in low back pain with standing ADLS to improve QOL.  Baseline:  Goal status: IN PROGRESS - 11/25/22 15% improvement  3.   Patient will demonstrate improved functional core strength as demonstrated by ability to stabilize with seated and prone MMT of LE. Baseline:  Goal status: MET  4.  Patient will report TBD on lumbar FOTO to demonstrate improved functional ability.  Baseline: TBD Goal status: IN PROGRESS - 11/25/22 56.3%  5.  Patient to demonstrate ability to achieve and maintain good spinal alignment/posturing and body mechanics needed for daily activities. Baseline: increased lordosis and ant pelvic tilt Goal status: IN PROGRESS  6.  Improved Mod Oswestry to <= 12% to demonstrate improved function Baseline: 12 / 50 = 24.0 % Goal status: IN PROGRESS - 11/25/22 16%  PLAN:  PT FREQUENCY: 2x/week  PT DURATION: 4 weeks  PLANNED INTERVENTIONS: Therapeutic exercises, Therapeutic activity, Neuromuscular re-education, Patient/Family education, Self Care, Joint mobilization, Dry Needling, Electrical stimulation, Spinal mobilization, Cryotherapy, Moist heat, Taping, and Manual therapy.  PLAN FOR NEXT SESSION:  focus on core strengthening and hip strengthening, avoid extension exercises   Artist Pais, PTA 11/30/2022, 9:29 AM

## 2022-12-02 ENCOUNTER — Ambulatory Visit: Payer: 59 | Admitting: Physical Therapy

## 2022-12-07 ENCOUNTER — Ambulatory Visit: Payer: 59

## 2022-12-07 DIAGNOSIS — M5459 Other low back pain: Secondary | ICD-10-CM | POA: Diagnosis not present

## 2022-12-07 DIAGNOSIS — M62838 Other muscle spasm: Secondary | ICD-10-CM

## 2022-12-07 DIAGNOSIS — M6281 Muscle weakness (generalized): Secondary | ICD-10-CM

## 2022-12-07 NOTE — Therapy (Signed)
OUTPATIENT PHYSICAL THERAPY TREATMENT    Patient Name: Albert Skinner MRN: [id number] DOB:1981/05/02, 42 y.o., male Today's Date: 12/07/2022  END OF SESSION:  PT End of Session - 12/07/22 0919     Visit Number 8    Number of Visits 17    Date for PT Re-Evaluation 01/06/23    Authorization Type [employer]    PT Start Time 0845    PT Stop Time 0927    PT Time Calculation (min) 42 min    Activity Tolerance Patient tolerated treatment well    Behavior During Therapy WFL for tasks assessed/performed                  Past Medical History:  Diagnosis Date   Anxiety    Fatty liver    Hypertension    Sleep apnea    uses CPAP   Past Surgical History:  Procedure Laterality Date   WISDOM TOOTH EXTRACTION     Patient Active Problem List   Diagnosis Date Noted   Spondylolisthesis at L4-L5 level 06/10/2017    PCP:  No PCP Per Patient  REFERRING PROVIDER: Kary Kos, MD  REFERRING DIAG: M43.16 (ICD-10-CM) - Spondylolisthesis, lumbar region   RATIONALE FOR EVALUATION AND TREATMENT: Rehabilitation  THERAPY DIAG:  Other low back pain  Muscle weakness (generalized)  Other muscle spasm  ONSET DATE: 2021  NEXT MD VISIT: TBD   SUBJECTIVE:                                                                                                                                                                                           SUBJECTIVE STATEMENT:  Pt reports a little LBP over the weekend not bad, was able to walk 1 hour with no pain  PAIN:  Are you having pain? No   PERTINENT HISTORY:  Anxiety, HTN, L4/5 fusion  PRECAUTIONS: None  WEIGHT BEARING RESTRICTIONS: No  FALLS:  Has patient fallen in last 6 months? No  LIVING ENVIRONMENT: Lives with: lives with their family Lives in: House/apartment Stairs: Yes: Internal: 13 steps; no issue with stairs Has following equipment at home: None  OCCUPATION: work from home; desk job  PLOF: Independent  PATIENT GOALS:  decrease pain   OBJECTIVE:   DIAGNOSTIC FINDINGS:   CT 09/08/19 IMPRESSION: 1. Query left side pain, and if so consider Acute Diverticulitis of the distal colon which may be partially visible on series 3, image 104. Follow-up CT Abdomen and Pelvis with oral and IV contrast would best evaluate further.   2. Postoperative changes at L4-L5 with developing interbody arthrodesis suspected and no adverse features.   3. Stable  since 2019 adjacent L3-L4 and L5-S1 degeneration. Mild bilateral L3 foraminal stenosis and chronically degenerated left L5-S1 assimilation joint.  PATIENT SURVEYS:  Modified Oswestry 12 / 50 = 24.0 %   SCREENING FOR RED FLAGS: negative  COGNITION: Overall cognitive status: Within functional limits for tasks assessed     SENSATION: WFL  MUSCLE LENGTH: HS: bil tightness Quads: WNL ITB: Piriformis:marked bil Hip Flexors: WNL Heelcords:  POSTURE: rounded shoulders, forward head, increased lumbar lordosis, and anterior pelvic tilt  PALPATION: L L5 TTP, Left gluteals espcially near PSIS No pain with PA mobs lumbar spine; step off at L3/4  LUMBAR ROM: WNL pain in L low back with R SB, stretch with R SB  LOWER EXTREMITY ROM:   WFL  LOWER EXTREMITY MMT:   Grossly 5/5, but notable core weakness L>R with MMT in sitting and prone.   TODAY'S TREATMENT:                                                                                                                              DATE:   12/07/22 THERAPEUTIC EXERCISE: to improve flexibility, strength and mobility.  Verbal and tactile cues throughout for technique.  Rec Bike - L4 x 6 min SLS R/L 2x each Farmer carry with 10lb weight R side 3 laps (66f) Supine LE extension with TrA x 10 bil Supine toe taps with TrA 2x10 bil Side planks 3x10" R/L Cat/cow x 10 both ways Bird dog 10x5"- cues to keep core engaged and back flat Supine L QL stretch 2x30 sec hold   11/30/22 THERAPEUTIC EXERCISE: to improve  flexibility, strength and mobility.  Verbal and tactile cues throughout for technique.  Rec Bike - L4 x 6 min Supine KTOS 2x30 sec bil Supine QL stretch 2x30 sec bil Counter support: -Standing hip abduction x 10 bil GTB at ankles -Standing hip extension x 10 bil GTB at ankles -Standing march GTB x 10 bil GTB at ankles  Manual Therapy: to decrease muscle spasm, pain and improve mobility.  STM to L QL and glutes 11/27/22 THERAPEUTIC EXERCISE: to improve flexibility, strength and mobility.  Verbal and tactile cues throughout for technique.  Rec Bike - L4 x 6 min Seated L/R hip hinge HS stretch 2 x 30" - 2nd rep with foot elevated on trash can laying on its side Seated L/R figure-4 piriformis stretch with hip hinge + slight overpressure 2 x 30" Seated L/R KTOS piriformis stretch 2 x 30" Seated 3-way lumbar flexion stretch with hands on rolling stool x 30" each Standing hip abduction with looped GTB at ankles x 10 bil Standing hip extension with looped GTB at ankles x 10 bil Standing hip flexion march with looped GTB at mid feet x 10 bil  MANUAL THERAPY: To promote normalized muscle tension, improved flexibility, increased ROM, and reduced pain. Skilled palpation and monitoring of soft tissue during DN Trigger Point Dry-Needling  Treatment instructions: Expect mild to moderate muscle soreness. Patient verbalized  understanding of these instructions and education. Patient Consent Given: Yes Education handout provided: Previously provided Muscles treated: L glute medius & minimus, medial & lateral piriformis Electrical stimulation performed: No Parameters: N/A Treatment response/outcome: Twitch Response Elicited and Palpable Increase in Muscle Length STM/DTM, manual TPR and pin & stretch to muscles addressed with DN   11/25/22 Rec Bike L2x 62mn Modified Oswestry Low Back Pain Disability Questionnaire: 8 / 50 = 16.0 % FOTO: Lumbar 56.3% LE MMT- 5/5 hip ABD, ext, flexors Standing pallof  press 10# 3x10 each side   11/19/22 THERAPEUTIC EXERCISE: to improve flexibility, strength and mobility.  Verbal and tactile cues throughout for technique. RecBike L4 x651m Seated sciatic nerve glide L x 10 Supine decompression - leg lengthener x 5 5 sec hold Supine sciatic nerve glide x 5 L Alt supine march with TA contraction x 5 each side 90/90 heel taps for TA x 10 ea, also did a few reps of full leg ext and dying bug to show progressions. Prone over pillow pelvic press plus alt hip ext x 10 Prone alt opp arm/leg x 3  Quadriped alt leg x 5 ea, then arm/leg x 5 ea Childs pose stretch x 30 sec each side  MANUAL THERAPY: To promote normalized muscle tension, improved flexibility, improved joint mobility, increased ROM, and reduced pain. Skilled palpation and monitoring of soft tissue during DN Trigger Point Dry-Needling  Treatment instructions: Expect mild to moderate muscle soreness. Patient verbalized understanding of these instructions and education. Patient Consent Given: Yes Education handout provided: Previously provided Muscles treated: L Glute Med along iliac crest, left L5 multifidi Electrical stimulation performed: No Parameters: N/A Treatment response/outcome: Twitch Response Elicited and Palpable Increase in Muscle Length   PATIENT EDUCATION:  Education details: HEP update Person educated: Patient Education method: Explanation, Demonstration, and Handouts Education comprehension: verbalized understanding and returned demonstration  HOME EXERCISE PROGRAM: Access Code: [email or web address] URL: [email or web address] Date: 11/27/2022 Prepared by: JoAnnie ParasExercises - Supine Hamstring Stretch with Strap  - 2 x daily - 7 x weekly - 2 sets - 3 reps - 30 sec hold - Supine Piriformis Stretch  - 2 x daily - 7 x weekly - 1 sets - 3 reps - 30-60 sec hold - Standing Quadratus Lumborum Stretch with Doorway  - 2 x daily - 7 x weekly - 1 sets - 2 reps - 60 sec hold -  Sidelying Thoracic Rotation with Open Book  - 1 x daily - 7 x weekly - 2 sets - 10 reps - 5 second hold hold - Supine Lower Trunk Rotation  - 1 x daily - 7 x weekly - 10 reps - 5 seconds  hold - Child's Pose Stretch  - 1 x daily - 7 x weekly - 2 sets - 30 seconds hold - Child's Pose with Sidebending  - 1 x daily - 7 x weekly - 2 sets - 30 seconds hold - Supine Active Straight Leg Raise  - 1 x daily - 7 x weekly - 2 sets - 10 reps - 5 sec hold - Supine Posterior Pelvic Tilt  - 1 x daily - 7 x weekly - 2 sets - 10 reps - 5 sec hold - Supine Bridge with Mini Swiss Ball Between Knees  - 1 x daily - 7 x weekly - 2 sets - 10 reps - 5 sec hold - Supine Sciatic Nerve Glide  - 2 x daily - 7 x weekly - 1 sets - 10 reps - 5 sec  hold - Seated Sciatic Tensioner  - 2 x daily - 7 x weekly - 1 sets - 10 reps - 5 second hold - Bird Dog  - 1 x daily - 7 x weekly - 1-3 sets - 10 reps - 5 sec hold - Supine 90/90 leg extensions  - 1 x daily - 7 x weekly - 1-3 sets - 10 reps - 3 sed hold - Hip Abduction with Resistance Loop  - 1 x daily - 3-4 x weekly - 2 sets - 10 reps - 3 sec hold - Hip Extension with Resistance Loop  - 1 x daily - 3-4 x weekly - 2 sets - 10 reps - 3 sec hold - Marching with Resistance  - 1 x daily - 3-4 x weekly - 2 sets - 10 reps - 3 sec hold  ASSESSMENT:  CLINICAL IMPRESSION:  Advanced through core stabilization exercises to improve strength and muscle activation during daily activities. Pt reports LBP improvement of 20% today showing 5% improvement. After the exercises he did have reports of his L QL becoming tight but we were able to stretch and relieve this. Cues required at times to keep core engaged and neutral spine.   OBJECTIVE IMPAIRMENTS: decreased activity tolerance, decreased strength, increased muscle spasms, impaired flexibility, postural dysfunction, and pain.   ACTIVITY LIMITATIONS: standing and locomotion level  PARTICIPATION LIMITATIONS:  when pain present  PERSONAL  FACTORS: Time since onset of injury/illness/exacerbation and 1-2 comorbidities: anxiety, L4/5 fusion  are also affecting patient's functional outcome.   REHAB POTENTIAL: Excellent  CLINICAL DECISION MAKING: Stable/uncomplicated  EVALUATION COMPLEXITY: Low   GOALS: Goals reviewed with patient? Yes  SHORT TERM GOALS: Target date: 11/11/2022   Patient will be independent with initial HEP.  Baseline:  Goal status: MET- 11/16/22  LONG TERM GOALS: Target date: 11/25/2022, extended to 01/06/2023   Patient will be independent with advanced/ongoing HEP to improve outcomes and carryover.  Baseline:  Goal status: IN PROGRESS  2.  Patient will report 75% improvement in low back pain with standing ADLS to improve QOL.  Baseline:  Goal status: IN PROGRESS - 12/07/22 20% improvement  3.   Patient will demonstrate improved functional core strength as demonstrated by ability to stabilize with seated and prone MMT of LE. Baseline:  Goal status: MET  4.  Patient will report TBD on lumbar FOTO to demonstrate improved functional ability.  Baseline: TBD Goal status: IN PROGRESS - 11/25/22 56.3%  5.  Patient to demonstrate ability to achieve and maintain good spinal alignment/posturing and body mechanics needed for daily activities. Baseline: increased lordosis and ant pelvic tilt Goal status: IN PROGRESS  6.  Improved Mod Oswestry to <= 12% to demonstrate improved function Baseline: 12 / 50 = 24.0 % Goal status: IN PROGRESS - 11/25/22 16%  PLAN:  PT FREQUENCY: 2x/week  PT DURATION: 4 weeks  PLANNED INTERVENTIONS: Therapeutic exercises, Therapeutic activity, Neuromuscular re-education, Patient/Family education, Self Care, Joint mobilization, Dry Needling, Electrical stimulation, Spinal mobilization, Cryotherapy, Moist heat, Taping, and Manual therapy.  PLAN FOR NEXT SESSION:  focus on core strengthening and hip strengthening, avoid extension exercises   Artist Pais, PTA 12/07/2022, 9:29  AM

## 2022-12-09 ENCOUNTER — Ambulatory Visit: Payer: 59 | Admitting: Physical Therapy

## 2022-12-09 ENCOUNTER — Encounter: Payer: Self-pay | Admitting: Physical Therapy

## 2022-12-09 DIAGNOSIS — M5459 Other low back pain: Secondary | ICD-10-CM | POA: Diagnosis not present

## 2022-12-09 DIAGNOSIS — M62838 Other muscle spasm: Secondary | ICD-10-CM

## 2022-12-09 DIAGNOSIS — M6281 Muscle weakness (generalized): Secondary | ICD-10-CM

## 2022-12-09 NOTE — Therapy (Signed)
OUTPATIENT PHYSICAL THERAPY TREATMENT    Patient Name: Albert Skinner MRN: [id number] DOB:[birth year], 42 y.o., male Today's Date: 12/09/2022  END OF SESSION:  PT End of Session - 12/09/22 0934     Visit Number 9    Number of Visits 17    Date for PT Re-Evaluation 01/06/23    Authorization Type [employer]    PT Start Time 0934    PT Stop Time 1014    PT Time Calculation (min) 40 min    Activity Tolerance Patient tolerated treatment well    Behavior During Therapy WFL for tasks assessed/performed                  Past Medical History:  Diagnosis Date   Anxiety    Fatty liver    Hypertension    Sleep apnea    uses CPAP   Past Surgical History:  Procedure Laterality Date   WISDOM TOOTH EXTRACTION     Patient Active Problem List   Diagnosis Date Noted   Spondylolisthesis at L4-L5 level 06/10/2017    PCP:  No PCP Per Patient  REFERRING PROVIDER: Kary Kos, MD  REFERRING DIAG: M43.16 (ICD-10-CM) - Spondylolisthesis, lumbar region   RATIONALE FOR EVALUATION AND TREATMENT: Rehabilitation  THERAPY DIAG:  Other low back pain  Muscle weakness (generalized)  Other muscle spasm  ONSET DATE: 2021  NEXT MD VISIT: TBD   SUBJECTIVE:                                                                                                                                                                                           SUBJECTIVE STATEMENT:  Pt denies pain but still notes the feeling of constant tightness in his low back. He also notes some soreness with prolonged standing and bending tasks such as washing the dishes or loading the dishwasher.  PAIN:  Are you having pain? No   PERTINENT HISTORY:  Anxiety, HTN, L4/5 fusion  PRECAUTIONS: None  WEIGHT BEARING RESTRICTIONS: No  FALLS:  Has patient fallen in last 6 months? No  LIVING ENVIRONMENT: Lives with: lives with their family Lives in: House/apartment Stairs: Yes: Internal: 13 steps; no issue with  stairs Has following equipment at home: None  OCCUPATION: work from home; desk job  PLOF: Independent  PATIENT GOALS: decrease pain   OBJECTIVE:   DIAGNOSTIC FINDINGS:   CT 09/08/19 IMPRESSION: 1. Query left side pain, and if so consider Acute Diverticulitis of the distal colon which may be partially visible on series 3, image 104. Follow-up CT Abdomen and Pelvis with oral and IV contrast would best evaluate further.   2. Postoperative  changes at L4-L5 with developing interbody arthrodesis suspected and no adverse features.   3. Stable since 2019 adjacent L3-L4 and L5-S1 degeneration. Mild bilateral L3 foraminal stenosis and chronically degenerated left L5-S1 assimilation joint.  PATIENT SURVEYS:  Modified Oswestry 12 / 50 = 24.0 %   SCREENING FOR RED FLAGS: negative  COGNITION: Overall cognitive status: Within functional limits for tasks assessed     SENSATION: WFL  MUSCLE LENGTH: HS: bil tightness Quads: WNL ITB: Piriformis:marked bil Hip Flexors: WNL Heelcords:  POSTURE: rounded shoulders, forward head, increased lumbar lordosis, and anterior pelvic tilt  PALPATION: L L5 TTP, Left gluteals espcially near PSIS No pain with PA mobs lumbar spine; step off at L3/4  LUMBAR ROM: WNL pain in L low back with R SB, stretch with R SB  LOWER EXTREMITY ROM:   WFL  LOWER EXTREMITY MMT:   Grossly 5/5, but notable core weakness L>R with MMT in sitting and prone.   TODAY'S TREATMENT:                                                                                                                              DATE:    12/09/22 THERAPEUTIC EXERCISE: to improve flexibility, strength and mobility.  Verbal and tactile cues throughout for technique.  UBE - L3.0 x 6 min (3' each fwd & back) Foam rolling to thoracolumbar paraspinals and QL, glutes, piriformis, HS and quads Thoracic extension mobs over FR  MANUAL THERAPY: To promote normalized muscle tension, improved  flexibility, improved joint mobility, and reduced pain. Skilled palpation and monitoring of soft tissue during DN Trigger Point Dry-Needling  Treatment instructions: Expect mild to moderate muscle soreness. Patient verbalized understanding of these instructions and education. Patient Consent Given: Yes Education handout provided: Previously provided Muscles treated: B glute medius and minimus - mid muscle belly and along iliac crest, B erector spinae and QL Electrical stimulation performed: No Parameters: N/A Treatment response/outcome: Twitch Response Elicited and Palpable Increase in Muscle Length STM/DTM, manual TPR and pin & stretch to muscles addressed with DN   12/07/22 THERAPEUTIC EXERCISE: to improve flexibility, strength and mobility.  Verbal and tactile cues throughout for technique.  Rec Bike - L4 x 6 min SLS R/L 2x each Farmer carry with 10lb weight R side 3 laps (30f) Supine LE extension with TrA x 10 bil Supine toe taps with TrA 2x10 bil Side planks 3x10" R/L Cat/cow x 10 both ways Bird dog 10x5"- cues to keep core engaged and back flat Supine L QL stretch 2x30 sec hold    11/30/22 THERAPEUTIC EXERCISE: to improve flexibility, strength and mobility.  Verbal and tactile cues throughout for technique.  Rec Bike - L4 x 6 min Supine KTOS 2x30 sec bil Supine QL stretch 2x30 sec bil Counter support: -Standing hip abduction x 10 bil GTB at ankles -Standing hip extension x 10 bil GTB at ankles -Standing march GTB x 10 bil GTB at ankles  Manual Therapy: to  decrease muscle spasm, pain and improve mobility.  STM to L QL and glutes   PATIENT EDUCATION:  Education details: self-STM techniques to Thoracolumbar paraspinals, glutes/piriformis and LEs using Foam roller Person educated: Patient Education method: Explanation and Demonstration Education comprehension: verbalized understanding and returned demonstration  HOME EXERCISE PROGRAM: Access Code: [email or web address] URL:  [email or web address] Date: 11/27/2022 Prepared by: Annie Paras  Exercises - Supine Hamstring Stretch with Strap  - 2 x daily - 7 x weekly - 2 sets - 3 reps - 30 sec hold - Supine Piriformis Stretch  - 2 x daily - 7 x weekly - 1 sets - 3 reps - 30-60 sec hold - Standing Quadratus Lumborum Stretch with Doorway  - 2 x daily - 7 x weekly - 1 sets - 2 reps - 60 sec hold - Sidelying Thoracic Rotation with Open Book  - 1 x daily - 7 x weekly - 2 sets - 10 reps - 5 second hold hold - Supine Lower Trunk Rotation  - 1 x daily - 7 x weekly - 10 reps - 5 seconds  hold - Child's Pose Stretch  - 1 x daily - 7 x weekly - 2 sets - 30 seconds hold - Child's Pose with Sidebending  - 1 x daily - 7 x weekly - 2 sets - 30 seconds hold - Supine Active Straight Leg Raise  - 1 x daily - 7 x weekly - 2 sets - 10 reps - 5 sec hold - Supine Posterior Pelvic Tilt  - 1 x daily - 7 x weekly - 2 sets - 10 reps - 5 sec hold - Supine Bridge with Mini Swiss Ball Between Knees  - 1 x daily - 7 x weekly - 2 sets - 10 reps - 5 sec hold - Supine Sciatic Nerve Glide  - 2 x daily - 7 x weekly - 1 sets - 10 reps - 5 sec hold - Seated Sciatic Tensioner  - 2 x daily - 7 x weekly - 1 sets - 10 reps - 5 second hold - Bird Dog  - 1 x daily - 7 x weekly - 1-3 sets - 10 reps - 5 sec hold - Supine 90/90 leg extensions  - 1 x daily - 7 x weekly - 1-3 sets - 10 reps - 3 sed hold - Hip Abduction with Resistance Loop  - 1 x daily - 3-4 x weekly - 2 sets - 10 reps - 3 sec hold - Hip Extension with Resistance Loop  - 1 x daily - 3-4 x weekly - 2 sets - 10 reps - 3 sec hold - Marching with Resistance  - 1 x daily - 3-4 x weekly - 2 sets - 10 reps - 3 sec hold  ASSESSMENT:  CLINICAL IMPRESSION:  Murl denies pain today but reports lingering chronic tightness in low back with increased muscle soreness and fatigue during prolonged standing and activities requiring leaning or bending forward such as washing dishes.  He reports good  awareness of posture and body mechanics and was able to verbalize appropriate movement patterns and positions in relation to the triggering activities of washing dishes. Provided instruction in use of foam roller to help alleviate muscle tension throughout thoracolumbar paraspinals, glutes and piriformis as well as for thoracic extension mobilization with patient noting good relief during trial and reporting that he thinks he has a foam roller at home.  Given ongoing increased muscle tension and tightness, proceeded with further MT incorporating DN with good  twitch responses elicited resulting in palpable reduction in muscle tension.  Giordano reports no issues with current HEP and is aware of need to continue with home exercises as well as use of foam roller to help further facilitate normalization of muscle tension following DN. He will continue to benefit from skilled PT to address ongoing abnormal muscle tension and core strength deficits to improve mobility and activity tolerance with decreased pain interference.   OBJECTIVE IMPAIRMENTS: decreased activity tolerance, decreased strength, increased muscle spasms, impaired flexibility, postural dysfunction, and pain.   ACTIVITY LIMITATIONS: standing and locomotion level  PARTICIPATION LIMITATIONS:  when pain present  PERSONAL FACTORS: Time since onset of injury/illness/exacerbation and 1-2 comorbidities: anxiety, L4/5 fusion  are also affecting patient's functional outcome.   REHAB POTENTIAL: Excellent  CLINICAL DECISION MAKING: Stable/uncomplicated  EVALUATION COMPLEXITY: Low   GOALS: Goals reviewed with patient? Yes  SHORT TERM GOALS: Target date: 11/11/2022   Patient will be independent with initial HEP.  Baseline:  Goal status: MET- 11/16/22  LONG TERM GOALS: Target date: 11/25/2022, extended to 01/06/2023   Patient will be independent with advanced/ongoing HEP to improve outcomes and carryover.  Baseline:  Goal status: IN PROGRESS   12/09/22 - met for current HEP  2.  Patient will report 75% improvement in low back pain with standing ADLS to improve QOL.  Baseline:  Goal status: IN PROGRESS  12/07/22 - 20% improvement  3.   Patient will demonstrate improved functional core strength as demonstrated by ability to stabilize with seated and prone MMT of LE. Baseline:  Goal status: MET  4.  Patient will report TBD on lumbar FOTO to demonstrate improved functional ability.  Baseline: TBD Goal status: IN PROGRESS  11/25/22 - 56.3%  5.  Patient to demonstrate ability to achieve and maintain good spinal alignment/posturing and body mechanics needed for daily activities. Baseline: increased lordosis and ant pelvic tilt Goal status: PARTIALLY MET  12/09/22 - Pt reports good awareness of posture with most daily tasks  6.  Improved Mod Oswestry to <= 12% to demonstrate improved function Baseline: 12 / 50 = 24.0 % Goal status: IN PROGRESS 11/25/22 - 16%  PLAN:  PT FREQUENCY: 2x/week  PT DURATION: 4 weeks  PLANNED INTERVENTIONS: Therapeutic exercises, Therapeutic activity, Neuromuscular re-education, Patient/Family education, Self Care, Joint mobilization, Dry Needling, Electrical stimulation, Spinal mobilization, Cryotherapy, Moist heat, Taping, and Manual therapy.  PLAN FOR NEXT SESSION:  focus on core strengthening and hip strengthening, avoid extension exercises; MT +/- DN as indicated   Percival Spanish, PT 12/09/2022, 12:44 PM

## 2022-12-14 ENCOUNTER — Ambulatory Visit: Payer: 59

## 2022-12-14 DIAGNOSIS — M5459 Other low back pain: Secondary | ICD-10-CM

## 2022-12-14 DIAGNOSIS — M62838 Other muscle spasm: Secondary | ICD-10-CM

## 2022-12-14 DIAGNOSIS — M6281 Muscle weakness (generalized): Secondary | ICD-10-CM

## 2022-12-14 NOTE — Therapy (Signed)
OUTPATIENT PHYSICAL THERAPY TREATMENT    Patient Name: Albert Skinner MRN: [id number] DOB:[birth year], 42 y.o., male Today's Date: 12/14/2022  END OF SESSION:  PT End of Session - 12/14/22 0856     Visit Number 10    Number of Visits 17    Date for PT Re-Evaluation 01/06/23    Authorization Type [employer]    PT Start Time [phone]   pt late   PT Stop Time 0930    PT Time Calculation (min) 39 min    Activity Tolerance Patient tolerated treatment well    Behavior During Therapy WFL for tasks assessed/performed                   Past Medical History:  Diagnosis Date   Anxiety    Fatty liver    Hypertension    Sleep apnea    uses CPAP   Past Surgical History:  Procedure Laterality Date   WISDOM TOOTH EXTRACTION     Patient Active Problem List   Diagnosis Date Noted   Spondylolisthesis at L4-L5 level 06/10/2017    PCP:  No PCP Per Patient  REFERRING PROVIDER: Kary Kos, MD  REFERRING DIAG: M43.16 (ICD-10-CM) - Spondylolisthesis, lumbar region   RATIONALE FOR EVALUATION AND TREATMENT: Rehabilitation  THERAPY DIAG:  Other low back pain  Muscle weakness (generalized)  Other muscle spasm  ONSET DATE: 2021  NEXT MD VISIT: TBD   SUBJECTIVE:                                                                                                                                                                                           SUBJECTIVE STATEMENT:  Cleaned the cars on Saturday and was sore afterwards, stretching helped afterwards as well. Feeling sore and stiff today.  PAIN:  Are you having pain? No   PERTINENT HISTORY:  Anxiety, HTN, L4/5 fusion  PRECAUTIONS: None  WEIGHT BEARING RESTRICTIONS: No  FALLS:  Has patient fallen in last 6 months? No  LIVING ENVIRONMENT: Lives with: lives with their family Lives in: House/apartment Stairs: Yes: Internal: 13 steps; no issue with stairs Has following equipment at home: None  OCCUPATION: work from home;  desk job  PLOF: Independent  PATIENT GOALS: decrease pain   OBJECTIVE:   DIAGNOSTIC FINDINGS:   CT 09/08/19 IMPRESSION: 1. Query left side pain, and if so consider Acute Diverticulitis of the distal colon which may be partially visible on series 3, image 104. Follow-up CT Abdomen and Pelvis with oral and IV contrast would best evaluate further.   2. Postoperative changes at L4-L5 with developing interbody arthrodesis suspected and no adverse features.  3. Stable since 2019 adjacent L3-L4 and L5-S1 degeneration. Mild bilateral L3 foraminal stenosis and chronically degenerated left L5-S1 assimilation joint.  PATIENT SURVEYS:  Modified Oswestry 12 / 50 = 24.0 %   SCREENING FOR RED FLAGS: negative  COGNITION: Overall cognitive status: Within functional limits for tasks assessed     SENSATION: WFL  MUSCLE LENGTH: HS: bil tightness Quads: WNL ITB: Piriformis:marked bil Hip Flexors: WNL Heelcords:  POSTURE: rounded shoulders, forward head, increased lumbar lordosis, and anterior pelvic tilt  PALPATION: L L5 TTP, Left gluteals espcially near PSIS No pain with PA mobs lumbar spine; step off at L3/4  LUMBAR ROM: WNL pain in L low back with R SB, stretch with R SB  LOWER EXTREMITY ROM:   WFL  LOWER EXTREMITY MMT:   Grossly 5/5, but notable core weakness L>R with MMT in sitting and prone.   TODAY'S TREATMENT:                                                                                                                              DATE:   12/14/22 THERAPEUTIC EXERCISE: to improve flexibility, strength and mobility.  Verbal and tactile cues throughout for technique. Rec Bike L4x43min LTR 5x each side; 3x10" afterwards each side S/L open book with hand behind head 3x10" bil Isometric dead bug with orange pball 3x10" Dead bug with orange pball 2x5  Side planks 2x10" each side L hip hikes on 8' step 2x10 Seated QL stretch with orange pball x 30 sec  Manual  Therapy: to decrease muscle spasm, pain and improve mobility.  STM to L QL, lumbar paraspinals  12/09/22 THERAPEUTIC EXERCISE: to improve flexibility, strength and mobility.  Verbal and tactile cues throughout for technique.  UBE - L3.0 x 6 min (3' each fwd & back) Foam rolling to thoracolumbar paraspinals and QL, glutes, piriformis, HS and quads Thoracic extension mobs over FR  MANUAL THERAPY: To promote normalized muscle tension, improved flexibility, improved joint mobility, and reduced pain. Skilled palpation and monitoring of soft tissue during DN Trigger Point Dry-Needling  Treatment instructions: Expect mild to moderate muscle soreness. Patient verbalized understanding of these instructions and education. Patient Consent Given: Yes Education handout provided: Previously provided Muscles treated: B glute medius and minimus - mid muscle belly and along iliac crest, B erector spinae and QL Electrical stimulation performed: No Parameters: N/A Treatment response/outcome: Twitch Response Elicited and Palpable Increase in Muscle Length STM/DTM, manual TPR and pin & stretch to muscles addressed with DN   12/07/22 THERAPEUTIC EXERCISE: to improve flexibility, strength and mobility.  Verbal and tactile cues throughout for technique.  Rec Bike - L4 x 6 min SLS R/L 2x each Farmer carry with 10lb weight R side 3 laps (80ft) Supine LE extension with TrA x 10 bil Supine toe taps with TrA 2x10 bil Side planks 3x10" R/L Cat/cow x 10 both ways Bird dog 10x5"- cues to keep core engaged and back flat Supine L QL  stretch 2x30 sec hold    11/30/22 THERAPEUTIC EXERCISE: to improve flexibility, strength and mobility.  Verbal and tactile cues throughout for technique.  Rec Bike - L4 x 6 min Supine KTOS 2x30 sec bil Supine QL stretch 2x30 sec bil Counter support: -Standing hip abduction x 10 bil GTB at ankles -Standing hip extension x 10 bil GTB at ankles -Standing march GTB x 10 bil GTB at  ankles  Manual Therapy: to decrease muscle spasm, pain and improve mobility.  STM to L QL and glutes   PATIENT EDUCATION:  Education details: self-STM techniques to Thoracolumbar paraspinals, glutes/piriformis and LEs using Foam roller Person educated: Patient Education method: Explanation and Demonstration Education comprehension: verbalized understanding and returned demonstration  HOME EXERCISE PROGRAM: Access Code: [email or web address] URL: [email or web address] Date: 11/27/2022 Prepared by: Annie Paras  Exercises - Supine Hamstring Stretch with Strap  - 2 x daily - 7 x weekly - 2 sets - 3 reps - 30 sec hold - Supine Piriformis Stretch  - 2 x daily - 7 x weekly - 1 sets - 3 reps - 30-60 sec hold - Standing Quadratus Lumborum Stretch with Doorway  - 2 x daily - 7 x weekly - 1 sets - 2 reps - 60 sec hold - Sidelying Thoracic Rotation with Open Book  - 1 x daily - 7 x weekly - 2 sets - 10 reps - 5 second hold hold - Supine Lower Trunk Rotation  - 1 x daily - 7 x weekly - 10 reps - 5 seconds  hold - Child's Pose Stretch  - 1 x daily - 7 x weekly - 2 sets - 30 seconds hold - Child's Pose with Sidebending  - 1 x daily - 7 x weekly - 2 sets - 30 seconds hold - Supine Active Straight Leg Raise  - 1 x daily - 7 x weekly - 2 sets - 10 reps - 5 sec hold - Supine Posterior Pelvic Tilt  - 1 x daily - 7 x weekly - 2 sets - 10 reps - 5 sec hold - Supine Bridge with Mini Swiss Ball Between Knees  - 1 x daily - 7 x weekly - 2 sets - 10 reps - 5 sec hold - Supine Sciatic Nerve Glide  - 2 x daily - 7 x weekly - 1 sets - 10 reps - 5 sec hold - Seated Sciatic Tensioner  - 2 x daily - 7 x weekly - 1 sets - 10 reps - 5 second hold - Bird Dog  - 1 x daily - 7 x weekly - 1-3 sets - 10 reps - 5 sec hold - Supine 90/90 leg extensions  - 1 x daily - 7 x weekly - 1-3 sets - 10 reps - 3 sed hold - Hip Abduction with Resistance Loop  - 1 x daily - 3-4 x weekly - 2 sets - 10 reps - 3 sec hold - Hip  Extension with Resistance Loop  - 1 x daily - 3-4 x weekly - 2 sets - 10 reps - 3 sec hold - Marching with Resistance  - 1 x daily - 3-4 x weekly - 2 sets - 10 reps - 3 sec hold  ASSESSMENT:  CLINICAL IMPRESSION:  Advanced through core stabilization exercises to tolerance in order to improve function with less LBP. He still has complaints of tightness and soreness along the L QL area with improvement after exercises and MT. He definitely continues to show weakness on L side of the  TrA vs R side. He had no increased pain after the exercises but continued reports of stiffness, he would most likely benefit from some DN next visit if still experiencing this.   OBJECTIVE IMPAIRMENTS: decreased activity tolerance, decreased strength, increased muscle spasms, impaired flexibility, postural dysfunction, and pain.   ACTIVITY LIMITATIONS: standing and locomotion level  PARTICIPATION LIMITATIONS:  when pain present  PERSONAL FACTORS: Time since onset of injury/illness/exacerbation and 1-2 comorbidities: anxiety, L4/5 fusion  are also affecting patient's functional outcome.   REHAB POTENTIAL: Excellent  CLINICAL DECISION MAKING: Stable/uncomplicated  EVALUATION COMPLEXITY: Low   GOALS: Goals reviewed with patient? Yes  SHORT TERM GOALS: Target date: 11/11/2022   Patient will be independent with initial HEP.  Baseline:  Goal status: MET- 11/16/22  LONG TERM GOALS: Target date: 11/25/2022, extended to 01/06/2023   Patient will be independent with advanced/ongoing HEP to improve outcomes and carryover.  Baseline:  Goal status: IN PROGRESS  12/09/22 - met for current HEP  2.  Patient will report 75% improvement in low back pain with standing ADLS to improve QOL.  Baseline:  Goal status: IN PROGRESS  12/07/22 - 20% improvement  3.   Patient will demonstrate improved functional core strength as demonstrated by ability to stabilize with seated and prone MMT of LE. Baseline:  Goal status: MET  4.   Patient will report TBD on lumbar FOTO to demonstrate improved functional ability.  Baseline: TBD Goal status: IN PROGRESS  11/25/22 - 56.3%  5.  Patient to demonstrate ability to achieve and maintain good spinal alignment/posturing and body mechanics needed for daily activities. Baseline: increased lordosis and ant pelvic tilt Goal status: PARTIALLY MET  12/09/22 - Pt reports good awareness of posture with most daily tasks  6.  Improved Mod Oswestry to <= 12% to demonstrate improved function Baseline: 12 / 50 = 24.0 % Goal status: IN PROGRESS 11/25/22 - 16%  PLAN:  PT FREQUENCY: 2x/week  PT DURATION: 4 weeks  PLANNED INTERVENTIONS: Therapeutic exercises, Therapeutic activity, Neuromuscular re-education, Patient/Family education, Self Care, Joint mobilization, Dry Needling, Electrical stimulation, Spinal mobilization, Cryotherapy, Moist heat, Taping, and Manual therapy.  PLAN FOR NEXT SESSION:  focus on core strengthening and hip strengthening, avoid extension exercises; MT +/- DN as indicated   Artist Pais, PTA 12/14/2022, 9:48 AM

## 2022-12-16 ENCOUNTER — Ambulatory Visit: Payer: 59 | Admitting: Physical Therapy

## 2022-12-21 ENCOUNTER — Encounter: Payer: Self-pay | Admitting: Physical Therapy

## 2022-12-21 ENCOUNTER — Ambulatory Visit: Payer: 59 | Admitting: Physical Therapy

## 2022-12-21 DIAGNOSIS — M6281 Muscle weakness (generalized): Secondary | ICD-10-CM

## 2022-12-21 DIAGNOSIS — M5459 Other low back pain: Secondary | ICD-10-CM

## 2022-12-21 DIAGNOSIS — M62838 Other muscle spasm: Secondary | ICD-10-CM

## 2022-12-21 NOTE — Therapy (Signed)
OUTPATIENT PHYSICAL THERAPY TREATMENT    Patient Name: Albert Skinner MRN: [id number] DOB:1981/03/24, 42 y.o., male Today's Date: 12/21/2022  END OF SESSION:  PT End of Session - 12/21/22 0855     Visit Number 11    Number of Visits 17    Date for PT Re-Evaluation 01/06/23    Authorization Type [employer]    PT Start Time 0855   Pt arrived late   PT Stop Time 0929    PT Time Calculation (min) 34 min    Activity Tolerance Patient tolerated treatment well    Behavior During Therapy WFL for tasks assessed/performed                   Past Medical History:  Diagnosis Date   Anxiety    Fatty liver    Hypertension    Sleep apnea    uses CPAP   Past Surgical History:  Procedure Laterality Date   WISDOM TOOTH EXTRACTION     Patient Active Problem List   Diagnosis Date Noted   Spondylolisthesis at L4-L5 level 06/10/2017    PCP:  No PCP Per Patient  REFERRING PROVIDER: Kary Kos, MD  REFERRING DIAG: M43.16 (ICD-10-CM) - Spondylolisthesis, lumbar region   RATIONALE FOR EVALUATION AND TREATMENT: Rehabilitation  THERAPY DIAG:  Other low back pain  Muscle weakness (generalized)  Other muscle spasm  ONSET DATE: 2021  NEXT MD VISIT: TBD   SUBJECTIVE:                                                                                                                                                                                           SUBJECTIVE STATEMENT:  Pt denies pain today, noting only "discomfort"  PAIN:  Are you having pain? No   PERTINENT HISTORY:  Anxiety, HTN, L4/5 fusion  PRECAUTIONS: None  WEIGHT BEARING RESTRICTIONS: No  FALLS:  Has patient fallen in last 6 months? No  LIVING ENVIRONMENT: Lives with: lives with their family Lives in: House/apartment Stairs: Yes: Internal: 13 steps; no issue with stairs Has following equipment at home: None  OCCUPATION: work from home; desk job  PLOF: Independent  PATIENT GOALS: decrease  pain   OBJECTIVE:   DIAGNOSTIC FINDINGS:   CT 09/08/19 IMPRESSION: 1. Query left side pain, and if so consider Acute Diverticulitis of the distal colon which may be partially visible on series 3, image 104. Follow-up CT Abdomen and Pelvis with oral and IV contrast would best evaluate further.   2. Postoperative changes at L4-L5 with developing interbody arthrodesis suspected and no adverse features.   3. Stable since 2019 adjacent L3-L4 and L5-S1 degeneration.  Mild bilateral L3 foraminal stenosis and chronically degenerated left L5-S1 assimilation joint.  PATIENT SURVEYS:  Modified Oswestry 12 / 50 = 24.0 %   SCREENING FOR RED FLAGS: negative  COGNITION: Overall cognitive status: Within functional limits for tasks assessed     SENSATION: WFL  MUSCLE LENGTH: HS: bil tightness Quads: WNL ITB: Piriformis:marked bil Hip Flexors: WNL Heelcords:  POSTURE: rounded shoulders, forward head, increased lumbar lordosis, and anterior pelvic tilt  PALPATION: L L5 TTP, Left gluteals espcially near PSIS No pain with PA mobs lumbar spine; step off at L3/4  LUMBAR ROM: WNL pain in L low back with R SB, stretch with R SB  LOWER EXTREMITY ROM:   WFL  LOWER EXTREMITY MMT:   Grossly 5/5, but notable core weakness L>R with MMT in sitting and prone.   TODAY'S TREATMENT:                                                                                                                              DATE:    12/21/22 THERAPEUTIC EXERCISE: to improve flexibility, strength and mobility.  Verbal and tactile cues throughout for technique.  UBE - L4.0 x 6 min (3' each fwd & back) 3-way child's pose x 30" each B QL stretch holding onto doorframe x 30" each  MANUAL THERAPY: To promote normalized muscle tension, improved flexibility, and reduced pain. Skilled palpation and monitoring of soft tissue during DN Trigger Point Dry-Needling  Treatment instructions: Expect mild to moderate muscle  soreness. S/S of pneumothorax if dry needled over a lung field, and to seek immediate medical attention should they occur. Patient verbalized understanding of these instructions and education. Patient Consent Given: Yes Education handout provided: Previously provided Muscles treated: L thoracolumbar multifidi, erector spinae and QL Electrical stimulation performed: No Parameters: N/A Treatment response/outcome: Twitch Response Elicited and Palpable Increase in Muscle Length STM/DTM, manual TPR and pin & stretch to muscles addressed with DN Ktape: 30-50% along B thoracolumbar paraspinals + perpendicular strip across level of greatest tension   12/14/22 THERAPEUTIC EXERCISE: to improve flexibility, strength and mobility.  Verbal and tactile cues throughout for technique. Rec Bike L4x71min LTR 5x each side; 3x10" afterwards each side S/L open book with hand behind head 3x10" bil Isometric dead bug with orange pball 3x10" Dead bug with orange pball 2x5  Side planks 2x10" each side L hip hikes on 8' step 2x10 Seated QL stretch with orange pball x 30 sec  Manual Therapy: to decrease muscle spasm, pain and improve mobility.  STM to L QL, lumbar paraspinals   12/09/22 THERAPEUTIC EXERCISE: to improve flexibility, strength and mobility.  Verbal and tactile cues throughout for technique.  UBE - L3.0 x 6 min (3' each fwd & back) Foam rolling to thoracolumbar paraspinals and QL, glutes, piriformis, HS and quads Thoracic extension mobs over FR  MANUAL THERAPY: To promote normalized muscle tension, improved flexibility, improved joint mobility, and reduced pain. Skilled palpation and monitoring of  soft tissue during DN Trigger Point Dry-Needling  Treatment instructions: Expect mild to moderate muscle soreness. Patient verbalized understanding of these instructions and education. Patient Consent Given: Yes Education handout provided: Previously provided Muscles treated: B glute medius and minimus  - mid muscle belly and along iliac crest, B erector spinae and QL Electrical stimulation performed: No Parameters: N/A Treatment response/outcome: Twitch Response Elicited and Palpable Increase in Muscle Length STM/DTM, manual TPR and pin & stretch to muscles addressed with DN   PATIENT EDUCATION:  Education details: self-STM techniques to Thoracolumbar paraspinals, glutes/piriformis and LEs using Foam roller Person educated: Patient Education method: Explanation and Demonstration Education comprehension: verbalized understanding and returned demonstration  HOME EXERCISE PROGRAM: Access Code: [email or web address] URL: [email or web address] Date: 11/27/2022 Prepared by: Annie Paras  Exercises - Supine Hamstring Stretch with Strap  - 2 x daily - 7 x weekly - 2 sets - 3 reps - 30 sec hold - Supine Piriformis Stretch  - 2 x daily - 7 x weekly - 1 sets - 3 reps - 30-60 sec hold - Standing Quadratus Lumborum Stretch with Doorway  - 2 x daily - 7 x weekly - 1 sets - 2 reps - 60 sec hold - Sidelying Thoracic Rotation with Open Book  - 1 x daily - 7 x weekly - 2 sets - 10 reps - 5 second hold hold - Supine Lower Trunk Rotation  - 1 x daily - 7 x weekly - 10 reps - 5 seconds  hold - Child's Pose Stretch  - 1 x daily - 7 x weekly - 2 sets - 30 seconds hold - Child's Pose with Sidebending  - 1 x daily - 7 x weekly - 2 sets - 30 seconds hold - Supine Active Straight Leg Raise  - 1 x daily - 7 x weekly - 2 sets - 10 reps - 5 sec hold - Supine Posterior Pelvic Tilt  - 1 x daily - 7 x weekly - 2 sets - 10 reps - 5 sec hold - Supine Bridge with Mini Swiss Ball Between Knees  - 1 x daily - 7 x weekly - 2 sets - 10 reps - 5 sec hold - Supine Sciatic Nerve Glide  - 2 x daily - 7 x weekly - 1 sets - 10 reps - 5 sec hold - Seated Sciatic Tensioner  - 2 x daily - 7 x weekly - 1 sets - 10 reps - 5 second hold - Bird Dog  - 1 x daily - 7 x weekly - 1-3 sets - 10 reps - 5 sec hold - Supine 90/90 leg  extensions  - 1 x daily - 7 x weekly - 1-3 sets - 10 reps - 3 sed hold - Hip Abduction with Resistance Loop  - 1 x daily - 3-4 x weekly - 2 sets - 10 reps - 3 sec hold - Hip Extension with Resistance Loop  - 1 x daily - 3-4 x weekly - 2 sets - 10 reps - 3 sec hold - Marching with Resistance  - 1 x daily - 3-4 x weekly - 2 sets - 10 reps - 3 sec hold   ASSESSMENT:  CLINICAL IMPRESSION:  Ryer reports he was able to try out the foam roller once at home and denies need for review of any of the positioning or self-STM. HEP also going well with no concerns identified, although he admits he tends to focus more on the stretches than the strengthening exercises. He notes ongoing  tightness/restriction in L paraspinals and QL with more tightness noted recently in the thoracolumbar junction - this was addressed with MT incorporating DN with good twitch responses elicited. Reviewed relevant stretches and encouraged increased home performance of strengthening component of HEP. Also initiated trial of kinesiotaping to B thoracolumbar paraspinals to promote further reduction in muscle tension.   OBJECTIVE IMPAIRMENTS: decreased activity tolerance, decreased strength, increased muscle spasms, impaired flexibility, postural dysfunction, and pain.   ACTIVITY LIMITATIONS: standing and locomotion level  PARTICIPATION LIMITATIONS:  when pain present  PERSONAL FACTORS: Time since onset of injury/illness/exacerbation and 1-2 comorbidities: anxiety, L4/5 fusion  are also affecting patient's functional outcome.   REHAB POTENTIAL: Excellent  CLINICAL DECISION MAKING: Stable/uncomplicated  EVALUATION COMPLEXITY: Low   GOALS: Goals reviewed with patient? Yes  SHORT TERM GOALS: Target date: 11/11/2022   Patient will be independent with initial HEP.  Baseline:  Goal status: MET- 11/16/22  LONG TERM GOALS: Target date: 11/25/2022, extended to 01/06/2023   Patient will be independent with advanced/ongoing HEP to  improve outcomes and carryover.  Baseline:  Goal status: IN PROGRESS  12/21/22 - met for current HEP  2.  Patient will report 75% improvement in low back pain with standing ADLS to improve QOL.  Baseline:  Goal status: IN PROGRESS  12/21/22 - 20% improvement  3.   Patient will demonstrate improved functional core strength as demonstrated by ability to stabilize with seated and prone MMT of LE. Baseline:  Goal status: MET  4.  Patient will report 60 on lumbar FOTO to demonstrate improved functional ability.  Baseline: 56.3% Goal status: IN PROGRESS  11/25/22 - 56.3%  5.  Patient to demonstrate ability to achieve and maintain good spinal alignment/posturing and body mechanics needed for daily activities. Baseline: increased lordosis and ant pelvic tilt Goal status: PARTIALLY MET  12/09/22 - Pt reports good awareness of posture with most daily tasks  6.  Improved Mod Oswestry to </= 12% to demonstrate improved function Baseline: 12 / 50 = 24.0 % Goal status: IN PROGRESS  11/25/22 - 16%  PLAN:  PT FREQUENCY: 2x/week  PT DURATION: 4 weeks  PLANNED INTERVENTIONS: Therapeutic exercises, Therapeutic activity, Neuromuscular re-education, Patient/Family education, Self Care, Joint mobilization, Dry Needling, Electrical stimulation, Spinal mobilization, Cryotherapy, Moist heat, Taping, and Manual therapy.  PLAN FOR NEXT SESSION:  Assess response to DN and Ktape; focus on core strengthening and hip strengthening, avoid extension exercises; MT +/- DN as indicated   Percival Spanish, PT 12/21/2022, 9:35 AM

## 2022-12-21 NOTE — Therapy (Signed)
OUTPATIENT PHYSICAL THERAPY TREATMENT    Patient Name: Albert Skinner MRN: [id number] DOB:1981-07-01, 42 y.o., male Today's Date: 12/23/2022  END OF SESSION:  PT End of Session - 12/23/22 0849     Visit Number 12    Number of Visits 17    Date for PT Re-Evaluation 01/06/23    Authorization Type [employer]    PT Start Time [phone]    PT Stop Time 0930    PT Time Calculation (min) 41 min    Activity Tolerance Patient tolerated treatment well    Behavior During Therapy WFL for tasks assessed/performed                    Past Medical History:  Diagnosis Date   Anxiety    Fatty liver    Hypertension    Sleep apnea    uses CPAP   Past Surgical History:  Procedure Laterality Date   WISDOM TOOTH EXTRACTION     Patient Active Problem List   Diagnosis Date Noted   Spondylolisthesis at L4-L5 level 06/10/2017    PCP:  No PCP Per Patient  REFERRING PROVIDER: Kary Kos, MD  REFERRING DIAG: M43.16 (ICD-10-CM) - Spondylolisthesis, lumbar region   RATIONALE FOR EVALUATION AND TREATMENT: Rehabilitation  THERAPY DIAG:  Other low back pain  Muscle weakness (generalized)  Other muscle spasm  ONSET DATE: 2021  NEXT MD VISIT: TBD   SUBJECTIVE:                                                                                                                                                                                           SUBJECTIVE STATEMENT:  Tightness above my hip on the left side. I think the tape helped.  PAIN:  Are you having pain? No   PERTINENT HISTORY:  Anxiety, HTN, L4/5 fusion  PRECAUTIONS: None  WEIGHT BEARING RESTRICTIONS: No  FALLS:  Has patient fallen in last 6 months? No  LIVING ENVIRONMENT: Lives with: lives with their family Lives in: House/apartment Stairs: Yes: Internal: 13 steps; no issue with stairs Has following equipment at home: None  OCCUPATION: work from home; desk job  PLOF: Independent  PATIENT GOALS: decrease  pain   OBJECTIVE:   DIAGNOSTIC FINDINGS:   CT 09/08/19 IMPRESSION: 1. Query left side pain, and if so consider Acute Diverticulitis of the distal colon which may be partially visible on series 3, image 104. Follow-up CT Abdomen and Pelvis with oral and IV contrast would best evaluate further.   2. Postoperative changes at L4-L5 with developing interbody arthrodesis suspected and no adverse features.   3. Stable since 2019 adjacent L3-L4  and L5-S1 degeneration. Mild bilateral L3 foraminal stenosis and chronically degenerated left L5-S1 assimilation joint.  PATIENT SURVEYS:  Modified Oswestry 12 / 50 = 24.0 %   SCREENING FOR RED FLAGS: negative  COGNITION: Overall cognitive status: Within functional limits for tasks assessed     SENSATION: WFL  MUSCLE LENGTH: HS: bil tightness Quads: WNL ITB: Piriformis:marked bil Hip Flexors: WNL Heelcords:  POSTURE: rounded shoulders, forward head, increased lumbar lordosis, and anterior pelvic tilt  PALPATION: L L5 TTP, Left gluteals espcially near PSIS No pain with PA mobs lumbar spine; step off at L3/4  LUMBAR ROM: WNL pain in L low back with R SB, stretch with R SB  LOWER EXTREMITY ROM:   WFL  LOWER EXTREMITY MMT:   Grossly 5/5, but notable core weakness L>R with MMT in sitting and prone.   TODAY'S TREATMENT:                                                                                                                              DATE:    12/23/22 THERAPEUTIC EXERCISE: to improve flexibility, strength and mobility.  Verbal and tactile cues throughout for technique.  Bike - L4.0 x 6 min Bird Dog x 10 using ruler for cueing  90/90 leg press was difficult to maintain neutral spine so went to hooklying sequential leg lifts x 5 ea side Side planks 2x10" each side then side plank with hip drop x 3  MANUAL THERAPY: To promote normalized muscle tension, improved flexibility, and reduced pain. Skilled palpation and  monitoring of soft tissue during DN Trigger Point Dry-Needling  Treatment instructions: Expect mild to moderate muscle soreness. S/S of pneumothorax if dry needled over a lung field, and to seek immediate medical attention should they occur. Patient verbalized understanding of these instructions and education. Patient Consent Given: Yes Education handout provided: Previously provided Muscles treated: L piriformis and gluteals Electrical stimulation performed: No Parameters: N/A Treatment response/outcome: Twitch Response Elicited and Palpable Increase in Muscle Length STM/DTM to muscles addressed with DN  12/21/22 THERAPEUTIC EXERCISE: to improve flexibility, strength and mobility.  Verbal and tactile cues throughout for technique.  UBE - L4.0 x 6 min (3' each fwd & back) 3-way child's pose x 30" each B QL stretch holding onto doorframe x 30" each  MANUAL THERAPY: To promote normalized muscle tension, improved flexibility, and reduced pain. Skilled palpation and monitoring of soft tissue during DN Trigger Point Dry-Needling  Treatment instructions: Expect mild to moderate muscle soreness. S/S of pneumothorax if dry needled over a lung field, and to seek immediate medical attention should they occur. Patient verbalized understanding of these instructions and education. Patient Consent Given: Yes Education handout provided: Previously provided Muscles treated: L thoracolumbar multifidi, erector spinae and QL Electrical stimulation performed: No Parameters: N/A Treatment response/outcome: Twitch Response Elicited and Palpable Increase in Muscle Length STM/DTM, manual TPR and pin & stretch to muscles addressed with DN Ktape: 30-50% along B thoracolumbar paraspinals +  perpendicular strip across level of greatest tension   12/14/22 THERAPEUTIC EXERCISE: to improve flexibility, strength and mobility.  Verbal and tactile cues throughout for technique. Rec Bike L4x59min LTR 5x each side; 3x10"  afterwards each side S/L open book with hand behind head 3x10" bil Isometric dead bug with orange pball 3x10" Dead bug with orange pball 2x5  Side planks 2x10" each side L hip hikes on 8' step 2x10 Seated QL stretch with orange pball x 30 sec  Manual Therapy: to decrease muscle spasm, pain and improve mobility.  STM to L QL, lumbar paraspinals   12/09/22 THERAPEUTIC EXERCISE: to improve flexibility, strength and mobility.  Verbal and tactile cues throughout for technique.  UBE - L3.0 x 6 min (3' each fwd & back) Foam rolling to thoracolumbar paraspinals and QL, glutes, piriformis, HS and quads Thoracic extension mobs over FR  MANUAL THERAPY: To promote normalized muscle tension, improved flexibility, improved joint mobility, and reduced pain. Skilled palpation and monitoring of soft tissue during DN Trigger Point Dry-Needling  Treatment instructions: Expect mild to moderate muscle soreness. Patient verbalized understanding of these instructions and education. Patient Consent Given: Yes Education handout provided: Previously provided Muscles treated: B glute medius and minimus - mid muscle belly and along iliac crest, B erector spinae and QL Electrical stimulation performed: No Parameters: N/A Treatment response/outcome: Twitch Response Elicited and Palpable Increase in Muscle Length STM/DTM, manual TPR and pin & stretch to muscles addressed with DN   PATIENT EDUCATION:  Education details: self-STM techniques to Thoracolumbar paraspinals, glutes/piriformis and LEs using Foam roller Person educated: Patient Education method: Explanation and Demonstration Education comprehension: verbalized understanding and returned demonstration  HOME EXERCISE PROGRAM: Access Code: [email or web address] URL: [email or web address] Date: 12/23/2022 Prepared by: Almyra Free  Exercises - Supine Hamstring Stretch with Strap  - 2 x daily - 7 x weekly - 2 sets - 3 reps - 30 sec hold - Supine Piriformis  Stretch  - 2 x daily - 7 x weekly - 1 sets - 3 reps - 30-60 sec hold - Standing Quadratus Lumborum Stretch with Doorway  - 2 x daily - 7 x weekly - 1 sets - 2 reps - 60 sec hold - Sidelying Thoracic Rotation with Open Book  - 1 x daily - 7 x weekly - 2 sets - 10 reps - 5 second hold hold - Supine Lower Trunk Rotation  - 1 x daily - 7 x weekly - 10 reps - 5 seconds  hold - Child's Pose Stretch  - 1 x daily - 7 x weekly - 2 sets - 30 seconds hold - Child's Pose with Sidebending  - 1 x daily - 7 x weekly - 2 sets - 30 seconds hold - Supine Active Straight Leg Raise  - 1 x daily - 7 x weekly - 2 sets - 10 reps - 5 sec hold - Supine Posterior Pelvic Tilt  - 1 x daily - 7 x weekly - 2 sets - 10 reps - 5 sec hold - Supine Bridge with Mini Swiss Ball Between Knees  - 1 x daily - 7 x weekly - 2 sets - 10 reps - 5 sec hold - Supine Sciatic Nerve Glide  - 2 x daily - 7 x weekly - 1 sets - 10 reps - 5 sec hold - Seated Sciatic Tensioner  - 2 x daily - 7 x weekly - 1 sets - 10 reps - 5 second hold - Bird Dog  - 1 x daily - 7 x  weekly - 1-3 sets - 10 reps - 5 sec hold - Supine 90/90 leg extensions  - 1 x daily - 7 x weekly - 1-3 sets - 10 reps - 3 sed hold - Hip Abduction with Resistance Loop  - 1 x daily - 3-4 x weekly - 2 sets - 10 reps - 3 sec hold - Hip Extension with Resistance Loop  - 1 x daily - 3-4 x weekly - 2 sets - 10 reps - 3 sec hold - Marching with Resistance  - 1 x daily - 3-4 x weekly - 2 sets - 10 reps - 3 sec hold - Hooklying Sequential Leg March and Lower  - 1 x daily - 3-4 x weekly - 3 sets - 10 reps - Side Plank with Hip Drops  - 1 x daily - 7 x weekly - 1 sets - 10 reps  ASSESSMENT:  CLINICAL IMPRESSION:  Akzel reports less fatigue with KT tape since last visit. Tape is still on and was left in place. We reviewed his TA contraction as he was having difficulty maintaining neutral spine with 90/90 leg press. Added sequential leg march as prep exercise with good engagement and control. He  continues to c/o pain in L L5 region. Excellent response to DN in L piriformis and gluteals today.  OBJECTIVE IMPAIRMENTS: decreased activity tolerance, decreased strength, increased muscle spasms, impaired flexibility, postural dysfunction, and pain.   ACTIVITY LIMITATIONS: standing and locomotion level  PARTICIPATION LIMITATIONS:  when pain present  PERSONAL FACTORS: Time since onset of injury/illness/exacerbation and 1-2 comorbidities: anxiety, L4/5 fusion  are also affecting patient's functional outcome.   REHAB POTENTIAL: Excellent  CLINICAL DECISION MAKING: Stable/uncomplicated  EVALUATION COMPLEXITY: Low   GOALS: Goals reviewed with patient? Yes  SHORT TERM GOALS: Target date: 11/11/2022   Patient will be independent with initial HEP.  Baseline:  Goal status: MET- 11/16/22  LONG TERM GOALS: Target date: 11/25/2022, extended to 01/06/2023   Patient will be independent with advanced/ongoing HEP to improve outcomes and carryover.  Baseline:  Goal status: IN PROGRESS  12/21/22 - met for current HEP  2.  Patient will report 75% improvement in low back pain with standing ADLS to improve QOL.  Baseline:  Goal status: IN PROGRESS  12/21/22 - 20% improvement  3.   Patient will demonstrate improved functional core strength as demonstrated by ability to stabilize with seated and prone MMT of LE. Baseline:  Goal status: MET  4.  Patient will report 60 on lumbar FOTO to demonstrate improved functional ability.  Baseline: 56.3% Goal status: IN PROGRESS  11/25/22 - 56.3%  5.  Patient to demonstrate ability to achieve and maintain good spinal alignment/posturing and body mechanics needed for daily activities. Baseline: increased lordosis and ant pelvic tilt Goal status: PARTIALLY MET  12/09/22 - Pt reports good awareness of posture with most daily tasks  6.  Improved Mod Oswestry to </= 12% to demonstrate improved function Baseline: 12 / 50 = 24.0 % Goal status: IN PROGRESS  11/25/22  - 16%  PLAN:  PT FREQUENCY: 2x/week  PT DURATION: 4 weeks  PLANNED INTERVENTIONS: Therapeutic exercises, Therapeutic activity, Neuromuscular re-education, Patient/Family education, Self Care, Joint mobilization, Dry Needling, Electrical stimulation, Spinal mobilization, Cryotherapy, Moist heat, Taping, and Manual therapy.  PLAN FOR NEXT SESSION:  Assess response to DN and Ktape; focus on core strengthening and hip strengthening, avoid extension exercises; MT +/- DN as indicated   Jerita Wimbush, PT 12/23/2022, 12:14 PM

## 2022-12-23 ENCOUNTER — Ambulatory Visit: Payer: 59 | Admitting: Physical Therapy

## 2022-12-23 ENCOUNTER — Encounter: Payer: Self-pay | Admitting: Physical Therapy

## 2022-12-23 DIAGNOSIS — M5459 Other low back pain: Secondary | ICD-10-CM

## 2022-12-23 DIAGNOSIS — M6281 Muscle weakness (generalized): Secondary | ICD-10-CM

## 2022-12-23 DIAGNOSIS — M62838 Other muscle spasm: Secondary | ICD-10-CM

## 2022-12-27 NOTE — Therapy (Signed)
OUTPATIENT PHYSICAL THERAPY TREATMENT    Patient Name: Albert Skinner MRN: [id number] DOB:1980/12/11, 42 y.o., male Today's Date: 12/28/2022  END OF SESSION:  PT End of Session - 12/28/22 0857     Visit Number 13    Number of Visits 17    Date for PT Re-Evaluation 01/06/23    Authorization Type [employer]    PT Start Time 0857    PT Stop Time 0933    PT Time Calculation (min) 36 min    Activity Tolerance Patient tolerated treatment well    Behavior During Therapy [facility] for tasks assessed/performed                     Past Medical History:  Diagnosis Date   Anxiety    Fatty liver    Hypertension    Sleep apnea    uses CPAP   Past Surgical History:  Procedure Laterality Date   WISDOM TOOTH EXTRACTION     Patient Active Problem List   Diagnosis Date Noted   Spondylolisthesis at L4-L5 level 06/10/2017    PCP:  No PCP Per Patient  REFERRING PROVIDER: Kary Kos, MD  REFERRING DIAG: M43.16 (ICD-10-CM) - Spondylolisthesis, lumbar region   RATIONALE FOR EVALUATION AND TREATMENT: Rehabilitation  THERAPY DIAG:  Other low back pain  Muscle weakness (generalized)  Other muscle spasm  ONSET DATE: 2021  NEXT MD VISIT: TBD   SUBJECTIVE:                                                                                                                                                                                           SUBJECTIVE STATEMENT:  On Saturday he started feeling left low back again. Had lifted some light table and chairs in the morning.  PAIN:  Are you having pain? No and Yes: NPRS scale: 2/10 Pain location: left lumbar Pain description: stiffness Aggravating factors: unsure Relieving factors:      PERTINENT HISTORY:  Anxiety, HTN, L4/5 fusion  PRECAUTIONS: None  WEIGHT BEARING RESTRICTIONS: No  FALLS:  Has patient fallen in last 6 months? No  LIVING ENVIRONMENT: Lives with: lives with their family Lives in: House/apartment Stairs:  Yes: Internal: 13 steps; no issue with stairs Has following equipment at home: None  OCCUPATION: work from home; desk job  PLOF: Independent  PATIENT GOALS: decrease pain   OBJECTIVE:   DIAGNOSTIC FINDINGS:   CT 09/08/19 IMPRESSION: 1. Query left side pain, and if so consider Acute Diverticulitis of the distal colon which may be partially visible on series 3, image 104. Follow-up CT Abdomen and Pelvis with oral and IV contrast would  best evaluate further.   2. Postoperative changes at L4-L5 with developing interbody arthrodesis suspected and no adverse features.   3. Stable since 2019 adjacent L3-L4 and L5-S1 degeneration. Mild bilateral L3 foraminal stenosis and chronically degenerated left L5-S1 assimilation joint.  PATIENT SURVEYS:  Modified Oswestry 12 / 50 = 24.0 %   SCREENING FOR RED FLAGS: negative  COGNITION: Overall cognitive status: Within functional limits for tasks assessed     SENSATION: WFL  MUSCLE LENGTH: HS: bil tightness Quads: WNL ITB: Piriformis:marked bil Hip Flexors: WNL Heelcords:  POSTURE: rounded shoulders, forward head, increased lumbar lordosis, and anterior pelvic tilt  PALPATION: L L5 TTP, Left gluteals espcially near PSIS No pain with PA mobs lumbar spine; step off at L3/4  LUMBAR ROM: WNL pain in L low back with R SB, stretch with R SB  LOWER EXTREMITY ROM:   WFL  LOWER EXTREMITY MMT:   Grossly 5/5, but notable core weakness L>R with MMT in sitting and prone.   TODAY'S TREATMENT:                                                                                                                              DATE:   4/1//24 THERAPEUTIC EXERCISE: to improve flexibility, strength and mobility.  Verbal and tactile cues throughout for technique.  Nustep - L5.0 x 5 min Hooklying DKTC x 10 SKTC x 30 sec MANUAL THERAPY: To promote normalized muscle tension, improved flexibility, and reduced pain. Skilled palpation and monitoring of  soft tissue during DN Trigger Point Dry-Needling  Treatment instructions: Expect mild to moderate muscle soreness. S/S of pneumothorax if dry needled over a lung field, and to seek immediate medical attention should they occur. Patient verbalized understanding of these instructions and education. Patient Consent Given: Yes Education handout provided: Previously provided Muscles treated: L gluteals along iliac crest and SIJ Electrical stimulation performed: No Parameters: N/A Treatment response/outcome: Twitch Response Elicited and Palpable Increase in Muscle Length Ktape: 30-50% along B thoracolumbar paraspinals + perpendicular strip across levels of greatest tension   12/23/22 THERAPEUTIC EXERCISE: to improve flexibility, strength and mobility.  Verbal and tactile cues throughout for technique.  Bike - L4.0 x 6 min Bird Dog x 10 using ruler for cueing  90/90 leg press was difficult to maintain neutral spine so went to hooklying sequential leg lifts x 5 ea side Side planks 2x10" each side then side plank with hip drop x 3  MANUAL THERAPY: To promote normalized muscle tension, improved flexibility, and reduced pain. Skilled palpation and monitoring of soft tissue during DN Trigger Point Dry-Needling  Treatment instructions: Expect mild to moderate muscle soreness. S/S of pneumothorax if dry needled over a lung field, and to seek immediate medical attention should they occur. Patient verbalized understanding of these instructions and education. Patient Consent Given: Yes Education handout provided: Previously provided Muscles treated: L piriformis and gluteals Electrical stimulation performed: No Parameters: N/A Treatment response/outcome: Twitch Response Elicited and Palpable  Increase in Muscle Length STM/DTM to muscles addressed with DN  12/21/22 THERAPEUTIC EXERCISE: to improve flexibility, strength and mobility.  Verbal and tactile cues throughout for technique.  UBE - L4.0 x 6 min  (3' each fwd & back) 3-way child's pose x 30" each B QL stretch holding onto doorframe x 30" each  MANUAL THERAPY: To promote normalized muscle tension, improved flexibility, and reduced pain. Skilled palpation and monitoring of soft tissue during DN Trigger Point Dry-Needling  Treatment instructions: Expect mild to moderate muscle soreness. S/S of pneumothorax if dry needled over a lung field, and to seek immediate medical attention should they occur. Patient verbalized understanding of these instructions and education. Patient Consent Given: Yes Education handout provided: Previously provided Muscles treated: L thoracolumbar multifidi, erector spinae and QL Electrical stimulation performed: No Parameters: N/A Treatment response/outcome: Twitch Response Elicited and Palpable Increase in Muscle Length STM/DTM, manual TPR and pin & stretch to muscles addressed with DN Ktape: 30-50% along B thoracolumbar paraspinals + perpendicular strip across level of greatest tension   12/14/22 THERAPEUTIC EXERCISE: to improve flexibility, strength and mobility.  Verbal and tactile cues throughout for technique. Rec Bike L4x72min LTR 5x each side; 3x10" afterwards each side S/L open book with hand behind head 3x10" bil Isometric dead bug with orange pball 3x10" Dead bug with orange pball 2x5  Side planks 2x10" each side L hip hikes on 8' step 2x10 Seated QL stretch with orange pball x 30 sec  Manual Therapy: to decrease muscle spasm, pain and improve mobility.  STM to L QL, lumbar paraspinals   12/09/22 THERAPEUTIC EXERCISE: to improve flexibility, strength and mobility.  Verbal and tactile cues throughout for technique.  UBE - L3.0 x 6 min (3' each fwd & back) Foam rolling to thoracolumbar paraspinals and QL, glutes, piriformis, HS and quads Thoracic extension mobs over FR  MANUAL THERAPY: To promote normalized muscle tension, improved flexibility, improved joint mobility, and reduced  pain. Skilled palpation and monitoring of soft tissue during DN Trigger Point Dry-Needling  Treatment instructions: Expect mild to moderate muscle soreness. Patient verbalized understanding of these instructions and education. Patient Consent Given: Yes Education handout provided: Previously provided Muscles treated: B glute medius and minimus - mid muscle belly and along iliac crest, B erector spinae and QL Electrical stimulation performed: No Parameters: N/A Treatment response/outcome: Twitch Response Elicited and Palpable Increase in Muscle Length STM/DTM, manual TPR and pin & stretch to muscles addressed with DN   PATIENT EDUCATION:  Education details: self-STM techniques to Thoracolumbar paraspinals, glutes/piriformis and LEs using Foam roller Person educated: Patient Education method: Explanation and Demonstration Education comprehension: verbalized understanding and returned demonstration  HOME EXERCISE PROGRAM: Access Code: [email or web address] URL: [email or web address] Date: 12/23/2022 Prepared by: Almyra Free  Exercises - Supine Hamstring Stretch with Strap  - 2 x daily - 7 x weekly - 2 sets - 3 reps - 30 sec hold - Supine Piriformis Stretch  - 2 x daily - 7 x weekly - 1 sets - 3 reps - 30-60 sec hold - Standing Quadratus Lumborum Stretch with Doorway  - 2 x daily - 7 x weekly - 1 sets - 2 reps - 60 sec hold - Sidelying Thoracic Rotation with Open Book  - 1 x daily - 7 x weekly - 2 sets - 10 reps - 5 second hold hold - Supine Lower Trunk Rotation  - 1 x daily - 7 x weekly - 10 reps - 5 seconds  hold - Child's Pose Stretch  -  1 x daily - 7 x weekly - 2 sets - 30 seconds hold - Child's Pose with Sidebending  - 1 x daily - 7 x weekly - 2 sets - 30 seconds hold - Supine Active Straight Leg Raise  - 1 x daily - 7 x weekly - 2 sets - 10 reps - 5 sec hold - Supine Posterior Pelvic Tilt  - 1 x daily - 7 x weekly - 2 sets - 10 reps - 5 sec hold - Supine Bridge with Mini Swiss Ball  Between Knees  - 1 x daily - 7 x weekly - 2 sets - 10 reps - 5 sec hold - Supine Sciatic Nerve Glide  - 2 x daily - 7 x weekly - 1 sets - 10 reps - 5 sec hold - Seated Sciatic Tensioner  - 2 x daily - 7 x weekly - 1 sets - 10 reps - 5 second hold - Bird Dog  - 1 x daily - 7 x weekly - 1-3 sets - 10 reps - 5 sec hold - Supine 90/90 leg extensions  - 1 x daily - 7 x weekly - 1-3 sets - 10 reps - 3 sed hold - Hip Abduction with Resistance Loop  - 1 x daily - 3-4 x weekly - 2 sets - 10 reps - 3 sec hold - Hip Extension with Resistance Loop  - 1 x daily - 3-4 x weekly - 2 sets - 10 reps - 3 sec hold - Marching with Resistance  - 1 x daily - 3-4 x weekly - 2 sets - 10 reps - 3 sec hold - Hooklying Sequential Leg March and Lower  - 1 x daily - 3-4 x weekly - 3 sets - 10 reps - Side Plank with Hip Drops  - 1 x daily - 7 x weekly - 1 sets - 10 reps  ASSESSMENT:  CLINICAL IMPRESSION:  Jaymir was 12 min late and reported pain mainly In left PSIS region since Saturday. He had done some light lifting that morning. He continues to get relief with DN, so this was done along with another application of KT tape as this decreases his fatigue in the low back. Avri continues to demonstrate potential for improvement and would benefit from continued skilled therapy to address impairments.    OBJECTIVE IMPAIRMENTS: decreased activity tolerance, decreased strength, increased muscle spasms, impaired flexibility, postural dysfunction, and pain.   ACTIVITY LIMITATIONS: standing and locomotion level  PARTICIPATION LIMITATIONS:  when pain present  PERSONAL FACTORS: Time since onset of injury/illness/exacerbation and 1-2 comorbidities: anxiety, L4/5 fusion  are also affecting patient's functional outcome.   REHAB POTENTIAL: Excellent  CLINICAL DECISION MAKING: Stable/uncomplicated  EVALUATION COMPLEXITY: Low   GOALS: Goals reviewed with patient? Yes  SHORT TERM GOALS: Target date: 11/11/2022   Patient will be  independent with initial HEP.  Baseline:  Goal status: MET- 11/16/22  LONG TERM GOALS: Target date: 11/25/2022, extended to 01/06/2023   Patient will be independent with advanced/ongoing HEP to improve outcomes and carryover.  Baseline:  Goal status: IN PROGRESS  12/21/22 - met for current HEP  2.  Patient will report 75% improvement in low back pain with standing ADLS to improve QOL.  Baseline:  Goal status: IN PROGRESS  12/21/22 - 20% improvement  3.   Patient will demonstrate improved functional core strength as demonstrated by ability to stabilize with seated and prone MMT of LE. Baseline:  Goal status: MET  4.  Patient will report 54  on lumbar FOTO to demonstrate improved functional ability.  Baseline: 56.3% Goal status: IN PROGRESS  11/25/22 - 56.3%  5.  Patient to demonstrate ability to achieve and maintain good spinal alignment/posturing and body mechanics needed for daily activities. Baseline: increased lordosis and ant pelvic tilt Goal status: PARTIALLY MET  12/09/22 - Pt reports good awareness of posture with most daily tasks  6.  Improved Mod Oswestry to </= 12% to demonstrate improved function Baseline: 12 / 50 = 24.0 % Goal status: IN PROGRESS  11/25/22 - 16%  PLAN:  PT FREQUENCY: 2x/week  PT DURATION: 4 weeks  PLANNED INTERVENTIONS: Therapeutic exercises, Therapeutic activity, Neuromuscular re-education, Patient/Family education, Self Care, Joint mobilization, Dry Needling, Electrical stimulation, Spinal mobilization, Cryotherapy, Moist heat, Taping, and Manual therapy.  PLAN FOR NEXT SESSION:  Assess response to DN and Ktape; focus on core strengthening and hip strengthening, avoid extension exercises; MT +/- DN as indicated   Ezio Wieck, PT 12/28/2022, 3:24 PM

## 2022-12-28 ENCOUNTER — Encounter: Payer: Self-pay | Admitting: Physical Therapy

## 2022-12-28 ENCOUNTER — Ambulatory Visit: Payer: 59 | Attending: Neurosurgery | Admitting: Physical Therapy

## 2022-12-28 DIAGNOSIS — M5459 Other low back pain: Secondary | ICD-10-CM | POA: Diagnosis present

## 2022-12-28 DIAGNOSIS — M6281 Muscle weakness (generalized): Secondary | ICD-10-CM | POA: Diagnosis present

## 2022-12-28 DIAGNOSIS — M62838 Other muscle spasm: Secondary | ICD-10-CM | POA: Diagnosis present

## 2022-12-30 ENCOUNTER — Ambulatory Visit: Payer: 59

## 2022-12-30 DIAGNOSIS — M62838 Other muscle spasm: Secondary | ICD-10-CM

## 2022-12-30 DIAGNOSIS — M6281 Muscle weakness (generalized): Secondary | ICD-10-CM

## 2022-12-30 DIAGNOSIS — M5459 Other low back pain: Secondary | ICD-10-CM | POA: Diagnosis not present

## 2022-12-30 NOTE — Therapy (Signed)
OUTPATIENT PHYSICAL THERAPY TREATMENT    Patient Name: Albert Skinner MRN: [id number] DOB:October 22, 1980, 42 y.o., male Today's Date: 12/30/2022  END OF SESSION:  PT End of Session - 12/30/22 0941     Visit Number 14    Number of Visits 17    Date for PT Re-Evaluation 01/06/23    Authorization Type [employer]    PT Start Time [phone]   pt late   PT Stop Time 0933    PT Time Calculation (min) 39 min    Activity Tolerance Patient tolerated treatment well    Behavior During Therapy WFL for tasks assessed/performed                      Past Medical History:  Diagnosis Date   Anxiety    Fatty liver    Hypertension    Sleep apnea    uses CPAP   Past Surgical History:  Procedure Laterality Date   WISDOM TOOTH EXTRACTION     Patient Active Problem List   Diagnosis Date Noted   Spondylolisthesis at L4-L5 level 06/10/2017    PCP:  No PCP Per Patient  REFERRING PROVIDER: Kary Kos, MD  REFERRING DIAG: M43.16 (ICD-10-CM) - Spondylolisthesis, lumbar region   RATIONALE FOR EVALUATION AND TREATMENT: Rehabilitation  THERAPY DIAG:  Other low back pain  Muscle weakness (generalized)  Other muscle spasm  ONSET DATE: 2021  NEXT MD VISIT: TBD   SUBJECTIVE:                                                                                                                                                                                           SUBJECTIVE STATEMENT:  Gets more tight in the evening   PAIN:  Are you having pain? No and Yes: NPRS scale: 2/10 Pain location: left lumbar Pain description: stiffness Aggravating factors: unsure Relieving factors:      PERTINENT HISTORY:  Anxiety, HTN, L4/5 fusion  PRECAUTIONS: None  WEIGHT BEARING RESTRICTIONS: No  FALLS:  Has patient fallen in last 6 months? No  LIVING ENVIRONMENT: Lives with: lives with their family Lives in: House/apartment Stairs: Yes: Internal: 13 steps; no issue with stairs Has following  equipment at home: None  OCCUPATION: work from home; desk job  PLOF: Independent  PATIENT GOALS: decrease pain   OBJECTIVE:   DIAGNOSTIC FINDINGS:   CT 09/08/19 IMPRESSION: 1. Query left side pain, and if so consider Acute Diverticulitis of the distal colon which may be partially visible on series 3, image 104. Follow-up CT Abdomen and Pelvis with oral and IV contrast would best evaluate further.   2. Postoperative changes  at L4-L5 with developing interbody arthrodesis suspected and no adverse features.   3. Stable since 2019 adjacent L3-L4 and L5-S1 degeneration. Mild bilateral L3 foraminal stenosis and chronically degenerated left L5-S1 assimilation joint.  PATIENT SURVEYS:  Modified Oswestry 12 / 50 = 24.0 %   SCREENING FOR RED FLAGS: negative  COGNITION: Overall cognitive status: Within functional limits for tasks assessed     SENSATION: WFL  MUSCLE LENGTH: HS: bil tightness Quads: WNL ITB: Piriformis:marked bil Hip Flexors: WNL Heelcords:  POSTURE: rounded shoulders, forward head, increased lumbar lordosis, and anterior pelvic tilt  PALPATION: L L5 TTP, Left gluteals espcially near PSIS No pain with PA mobs lumbar spine; step off at L3/4  LUMBAR ROM: WNL pain in L low back with R SB, stretch with R SB  LOWER EXTREMITY ROM:   WFL  LOWER EXTREMITY MMT:   Grossly 5/5, but notable core weakness L>R with MMT in sitting and prone.   TODAY'S TREATMENT:                                                                                                                              DATE:   12/30/22 THERAPEUTIC EXERCISE: to improve flexibility, strength and mobility.  Verbal and tactile cues throughout for technique.  Bike L3x22min Supine march focusing on TrA engagement 2x10 Supine alt LE extension x 10 with TrA Supine hip flexion isometrics 2x5 5 sec hold  STM to L QL and lumbar paraspinals  KT tape to thoracolumbar paraspinals 2 strips along the muscles, 1  perpendicular strip  4/1//24 THERAPEUTIC EXERCISE: to improve flexibility, strength and mobility.  Verbal and tactile cues throughout for technique.  Nustep - L5.0 x 5 min Hooklying DKTC x 10 SKTC x 30 sec MANUAL THERAPY: To promote normalized muscle tension, improved flexibility, and reduced pain. Skilled palpation and monitoring of soft tissue during DN Trigger Point Dry-Needling  Treatment instructions: Expect mild to moderate muscle soreness. S/S of pneumothorax if dry needled over a lung field, and to seek immediate medical attention should they occur. Patient verbalized understanding of these instructions and education. Patient Consent Given: Yes Education handout provided: Previously provided Muscles treated: L gluteals along iliac crest and SIJ Electrical stimulation performed: No Parameters: N/A Treatment response/outcome: Twitch Response Elicited and Palpable Increase in Muscle Length Ktape: 30-50% along B thoracolumbar paraspinals + perpendicular strip across levels of greatest tension   12/23/22 THERAPEUTIC EXERCISE: to improve flexibility, strength and mobility.  Verbal and tactile cues throughout for technique.  Bike - L4.0 x 6 min Bird Dog x 10 using ruler for cueing  90/90 leg press was difficult to maintain neutral spine so went to hooklying sequential leg lifts x 5 ea side Side planks 2x10" each side then side plank with hip drop x 3  MANUAL THERAPY: To promote normalized muscle tension, improved flexibility, and reduced pain. Skilled palpation and monitoring of soft tissue during DN Trigger Point Dry-Needling  Treatment instructions: Expect mild to  moderate muscle soreness. S/S of pneumothorax if dry needled over a lung field, and to seek immediate medical attention should they occur. Patient verbalized understanding of these instructions and education. Patient Consent Given: Yes Education handout provided: Previously provided Muscles treated: L piriformis and  gluteals Electrical stimulation performed: No Parameters: N/A Treatment response/outcome: Twitch Response Elicited and Palpable Increase in Muscle Length STM/DTM to muscles addressed with DN  12/21/22 THERAPEUTIC EXERCISE: to improve flexibility, strength and mobility.  Verbal and tactile cues throughout for technique.  UBE - L4.0 x 6 min (3' each fwd & back) 3-way child's pose x 30" each B QL stretch holding onto doorframe x 30" each  MANUAL THERAPY: To promote normalized muscle tension, improved flexibility, and reduced pain. Skilled palpation and monitoring of soft tissue during DN Trigger Point Dry-Needling  Treatment instructions: Expect mild to moderate muscle soreness. S/S of pneumothorax if dry needled over a lung field, and to seek immediate medical attention should they occur. Patient verbalized understanding of these instructions and education. Patient Consent Given: Yes Education handout provided: Previously provided Muscles treated: L thoracolumbar multifidi, erector spinae and QL Electrical stimulation performed: No Parameters: N/A Treatment response/outcome: Twitch Response Elicited and Palpable Increase in Muscle Length STM/DTM, manual TPR and pin & stretch to muscles addressed with DN Ktape: 30-50% along B thoracolumbar paraspinals + perpendicular strip across level of greatest tension   12/14/22 THERAPEUTIC EXERCISE: to improve flexibility, strength and mobility.  Verbal and tactile cues throughout for technique. Rec Bike L4x27min LTR 5x each side; 3x10" afterwards each side S/L open book with hand behind head 3x10" bil Isometric dead bug with orange pball 3x10" Dead bug with orange pball 2x5  Side planks 2x10" each side L hip hikes on 8' step 2x10 Seated QL stretch with orange pball x 30 sec  Manual Therapy: to decrease muscle spasm, pain and improve mobility.  STM to L QL, lumbar paraspinals   12/09/22 THERAPEUTIC EXERCISE: to improve flexibility, strength  and mobility.  Verbal and tactile cues throughout for technique.  UBE - L3.0 x 6 min (3' each fwd & back) Foam rolling to thoracolumbar paraspinals and QL, glutes, piriformis, HS and quads Thoracic extension mobs over FR  MANUAL THERAPY: To promote normalized muscle tension, improved flexibility, improved joint mobility, and reduced pain. Skilled palpation and monitoring of soft tissue during DN Trigger Point Dry-Needling  Treatment instructions: Expect mild to moderate muscle soreness. Patient verbalized understanding of these instructions and education. Patient Consent Given: Yes Education handout provided: Previously provided Muscles treated: B glute medius and minimus - mid muscle belly and along iliac crest, B erector spinae and QL Electrical stimulation performed: No Parameters: N/A Treatment response/outcome: Twitch Response Elicited and Palpable Increase in Muscle Length STM/DTM, manual TPR and pin & stretch to muscles addressed with DN   PATIENT EDUCATION:  Education details: self-STM techniques to Thoracolumbar paraspinals, glutes/piriformis and LEs using Foam roller Person educated: Patient Education method: Explanation and Demonstration Education comprehension: verbalized understanding and returned demonstration  HOME EXERCISE PROGRAM: Access Code: [email or web address] URL: [email or web address] Date: 12/23/2022 Prepared by: Almyra Free  Exercises - Supine Hamstring Stretch with Strap  - 2 x daily - 7 x weekly - 2 sets - 3 reps - 30 sec hold - Supine Piriformis Stretch  - 2 x daily - 7 x weekly - 1 sets - 3 reps - 30-60 sec hold - Standing Quadratus Lumborum Stretch with Doorway  - 2 x daily - 7 x weekly - 1 sets - 2 reps -  60 sec hold - Sidelying Thoracic Rotation with Open Book  - 1 x daily - 7 x weekly - 2 sets - 10 reps - 5 second hold hold - Supine Lower Trunk Rotation  - 1 x daily - 7 x weekly - 10 reps - 5 seconds  hold - Child's Pose Stretch  - 1 x daily - 7 x  weekly - 2 sets - 30 seconds hold - Child's Pose with Sidebending  - 1 x daily - 7 x weekly - 2 sets - 30 seconds hold - Supine Active Straight Leg Raise  - 1 x daily - 7 x weekly - 2 sets - 10 reps - 5 sec hold - Supine Posterior Pelvic Tilt  - 1 x daily - 7 x weekly - 2 sets - 10 reps - 5 sec hold - Supine Bridge with Mini Swiss Ball Between Knees  - 1 x daily - 7 x weekly - 2 sets - 10 reps - 5 sec hold - Supine Sciatic Nerve Glide  - 2 x daily - 7 x weekly - 1 sets - 10 reps - 5 sec hold - Seated Sciatic Tensioner  - 2 x daily - 7 x weekly - 1 sets - 10 reps - 5 second hold - Bird Dog  - 1 x daily - 7 x weekly - 1-3 sets - 10 reps - 5 sec hold - Supine 90/90 leg extensions  - 1 x daily - 7 x weekly - 1-3 sets - 10 reps - 3 sed hold - Hip Abduction with Resistance Loop  - 1 x daily - 3-4 x weekly - 2 sets - 10 reps - 3 sec hold - Hip Extension with Resistance Loop  - 1 x daily - 3-4 x weekly - 2 sets - 10 reps - 3 sec hold - Marching with Resistance  - 1 x daily - 3-4 x weekly - 2 sets - 10 reps - 3 sec hold - Hooklying Sequential Leg March and Lower  - 1 x daily - 3-4 x weekly - 3 sets - 10 reps - Side Plank with Hip Drops  - 1 x daily - 7 x weekly - 1 sets - 10 reps  ASSESSMENT:  CLINICAL IMPRESSION:  Randyl was late to session. We continued focusing on core stability with lower level exercises to emphasize more muscle activation. He did note benefit from KT tape so we continued with this today as well. [place] continues to demonstrate potential for improvement and would benefit from continued skilled therapy to address impairments.    OBJECTIVE IMPAIRMENTS: decreased activity tolerance, decreased strength, increased muscle spasms, impaired flexibility, postural dysfunction, and pain.   ACTIVITY LIMITATIONS: standing and locomotion level  PARTICIPATION LIMITATIONS:  when pain present  PERSONAL FACTORS: Time since onset of injury/illness/exacerbation and 1-2 comorbidities: anxiety, L4/5  fusion  are also affecting patient's functional outcome.   REHAB POTENTIAL: Excellent  CLINICAL DECISION MAKING: Stable/uncomplicated  EVALUATION COMPLEXITY: Low   GOALS: Goals reviewed with patient? Yes  SHORT TERM GOALS: Target date: 11/11/2022   Patient will be independent with initial HEP.  Baseline:  Goal status: MET- 11/16/22  LONG TERM GOALS: Target date: 11/25/2022, extended to 01/06/2023   Patient will be independent with advanced/ongoing HEP to improve outcomes and carryover.  Baseline:  Goal status: IN PROGRESS  12/21/22 - met for current HEP  2.  Patient will report 75% improvement in low back pain with standing ADLS to improve QOL.  Baseline:  Goal status:  IN PROGRESS  12/21/22 - 20% improvement  3.   Patient will demonstrate improved functional core strength as demonstrated by ability to stabilize with seated and prone MMT of LE. Baseline:  Goal status: MET  4.  Patient will report 60 on lumbar FOTO to demonstrate improved functional ability.  Baseline: 56.3% Goal status: IN PROGRESS  11/25/22 - 56.3%  5.  Patient to demonstrate ability to achieve and maintain good spinal alignment/posturing and body mechanics needed for daily activities. Baseline: increased lordosis and ant pelvic tilt Goal status: PARTIALLY MET  12/09/22 - Pt reports good awareness of posture with most daily tasks  6.  Improved Mod Oswestry to </= 12% to demonstrate improved function Baseline: 12 / 50 = 24.0 % Goal status: IN PROGRESS  11/25/22 - 16%  PLAN:  PT FREQUENCY: 2x/week  PT DURATION: 4 weeks  PLANNED INTERVENTIONS: Therapeutic exercises, Therapeutic activity, Neuromuscular re-education, Patient/Family education, Self Care, Joint mobilization, Dry Needling, Electrical stimulation, Spinal mobilization, Cryotherapy, Moist heat, Taping, and Manual therapy.  PLAN FOR NEXT SESSION:  continue KT tape and DN; focus on core strengthening and hip strengthening, avoid extension exercises; MT  +/- DN as indicated   Artist Pais, PTA 12/30/2022, 10:04 AM

## 2023-01-05 NOTE — Therapy (Signed)
OUTPATIENT PHYSICAL THERAPY TREATMENT    Patient Name: Albert Skinner MRN: [id number] DOB:[birth year], 42 y.o., male Today's Date: 01/05/2023  END OF SESSION:             Past Medical History:  Diagnosis Date   Anxiety    Fatty liver    Hypertension    Sleep apnea    uses CPAP   Past Surgical History:  Procedure Laterality Date   WISDOM TOOTH EXTRACTION     Patient Active Problem List   Diagnosis Date Noted   Spondylolisthesis at L4-L5 level 06/10/2017    PCP:  No PCP Per Patient  REFERRING PROVIDER: Donalee Citrinram, Gary, MD  REFERRING DIAG: M43.16 (ICD-10-CM) - Spondylolisthesis, lumbar region   RATIONALE FOR EVALUATION AND TREATMENT: Rehabilitation  THERAPY DIAG:  No diagnosis found.  ONSET DATE: 2021  NEXT MD VISIT: TBD   SUBJECTIVE:                                                                                                                                                                                           SUBJECTIVE STATEMENT:  ***  PAIN:  Are you having pain? No and Yes: NPRS scale: 2/10 Pain location: left lumbar Pain description: stiffness Aggravating factors: unsure Relieving factors:      PERTINENT HISTORY:  Anxiety, HTN, L4/5 fusion  PRECAUTIONS: None  WEIGHT BEARING RESTRICTIONS: No  FALLS:  Has patient fallen in last 6 months? No  LIVING ENVIRONMENT: Lives with: lives with their family Lives in: House/apartment Stairs: Yes: Internal: 13 steps; no issue with stairs Has following equipment at home: None  OCCUPATION: work from home; desk job  PLOF: Independent  PATIENT GOALS: decrease pain   OBJECTIVE:   DIAGNOSTIC FINDINGS:   CT 09/08/19 IMPRESSION: 1. Query left side pain, and if so consider Acute Diverticulitis of the distal colon which may be partially visible on series 3, image 104. Follow-up CT Abdomen and Pelvis with oral and IV contrast would best evaluate further.   2. Postoperative changes at L4-L5  with developing interbody arthrodesis suspected and no adverse features.   3. Stable since 2019 adjacent L3-L4 and L5-S1 degeneration. Mild bilateral L3 foraminal stenosis and chronically degenerated left L5-S1 assimilation joint.  PATIENT SURVEYS:  Modified Oswestry 12 / 50 = 24.0 %   SCREENING FOR RED FLAGS: negative  COGNITION: Overall cognitive status: Within functional limits for tasks assessed     SENSATION: WFL  MUSCLE LENGTH: HS: bil tightness Quads: WNL ITB: Piriformis:marked bil Hip Flexors: WNL Heelcords:  POSTURE: rounded shoulders, forward head, increased lumbar lordosis, and anterior pelvic tilt  PALPATION: L L5 TTP, Left gluteals  espcially near PSIS No pain with PA mobs lumbar spine; step off at L3/4  LUMBAR ROM: WNL pain in L low back with R SB, stretch with R SB  LOWER EXTREMITY ROM:   WFL  LOWER EXTREMITY MMT:   Grossly 5/5, but notable core weakness L>R with MMT in sitting and prone.   TODAY'S TREATMENT:                                                                                                                              DATE:   01/06/23 THERAPEUTIC EXERCISE: to improve flexibility, strength and mobility.  Verbal and tactile cues throughout for technique.  Bike L3x67min    12/30/22 THERAPEUTIC EXERCISE: to improve flexibility, strength and mobility.  Verbal and tactile cues throughout for technique.  Bike L3x46min Supine march focusing on TrA engagement 2x10 Supine alt LE extension x 10 with TrA Supine hip flexion isometrics 2x5 5 sec hold  STM to L QL and lumbar paraspinals  KT tape to thoracolumbar paraspinals 2 strips along the muscles, 1 perpendicular strip  4/1//24 THERAPEUTIC EXERCISE: to improve flexibility, strength and mobility.  Verbal and tactile cues throughout for technique.  Nustep - L5.0 x 5 min Hooklying DKTC x 10 SKTC x 30 sec MANUAL THERAPY: To promote normalized muscle tension, improved flexibility, and reduced  pain. Skilled palpation and monitoring of soft tissue during DN Trigger Point Dry-Needling  Treatment instructions: Expect mild to moderate muscle soreness. S/S of pneumothorax if dry needled over a lung field, and to seek immediate medical attention should they occur. Patient verbalized understanding of these instructions and education. Patient Consent Given: Yes Education handout provided: Previously provided Muscles treated: L gluteals along iliac crest and SIJ Electrical stimulation performed: No Parameters: N/A Treatment response/outcome: Twitch Response Elicited and Palpable Increase in Muscle Length Ktape: 30-50% along B thoracolumbar paraspinals + perpendicular strip across levels of greatest tension   12/23/22 THERAPEUTIC EXERCISE: to improve flexibility, strength and mobility.  Verbal and tactile cues throughout for technique.  Bike - L4.0 x 6 min Bird Dog x 10 using ruler for cueing  90/90 leg press was difficult to maintain neutral spine so went to hooklying sequential leg lifts x 5 ea side Side planks 2x10" each side then side plank with hip drop x 3  MANUAL THERAPY: To promote normalized muscle tension, improved flexibility, and reduced pain. Skilled palpation and monitoring of soft tissue during DN Trigger Point Dry-Needling  Treatment instructions: Expect mild to moderate muscle soreness. S/S of pneumothorax if dry needled over a lung field, and to seek immediate medical attention should they occur. Patient verbalized understanding of these instructions and education. Patient Consent Given: Yes Education handout provided: Previously provided Muscles treated: L piriformis and gluteals Electrical stimulation performed: No Parameters: N/A Treatment response/outcome: Twitch Response Elicited and Palpable Increase in Muscle Length STM/DTM to muscles addressed with DN  12/21/22 THERAPEUTIC EXERCISE: to improve flexibility, strength and mobility.  Verbal and tactile  cues  throughout for technique.  UBE - L4.0 x 6 min (3' each fwd & back) 3-way child's pose x 30" each B QL stretch holding onto doorframe x 30" each  MANUAL THERAPY: To promote normalized muscle tension, improved flexibility, and reduced pain. Skilled palpation and monitoring of soft tissue during DN Trigger Point Dry-Needling  Treatment instructions: Expect mild to moderate muscle soreness. S/S of pneumothorax if dry needled over a lung field, and to seek immediate medical attention should they occur. Patient verbalized understanding of these instructions and education. Patient Consent Given: Yes Education handout provided: Previously provided Muscles treated: L thoracolumbar multifidi, erector spinae and QL Electrical stimulation performed: No Parameters: N/A Treatment response/outcome: Twitch Response Elicited and Palpable Increase in Muscle Length STM/DTM, manual TPR and pin & stretch to muscles addressed with DN Ktape: 30-50% along B thoracolumbar paraspinals + perpendicular strip across level of greatest tension   12/14/22 THERAPEUTIC EXERCISE: to improve flexibility, strength and mobility.  Verbal and tactile cues throughout for technique. Rec Bike L4x1min LTR 5x each side; 3x10" afterwards each side S/L open book with hand behind head 3x10" bil Isometric dead bug with orange pball 3x10" Dead bug with orange pball 2x5  Side planks 2x10" each side L hip hikes on 8' step 2x10 Seated QL stretch with orange pball x 30 sec  Manual Therapy: to decrease muscle spasm, pain and improve mobility.  STM to L QL, lumbar paraspinals   PATIENT EDUCATION:  Education details: self-STM techniques to Thoracolumbar paraspinals, glutes/piriformis and LEs using Foam roller Person educated: Patient Education method: Explanation and Demonstration Education comprehension: verbalized understanding and returned demonstration  HOME EXERCISE PROGRAM: Access Code: [email or web address] URL:  [email or web address] Date: 12/23/2022 Prepared by: Raynelle Fanning  Exercises - Supine Hamstring Stretch with Strap  - 2 x daily - 7 x weekly - 2 sets - 3 reps - 30 sec hold - Supine Piriformis Stretch  - 2 x daily - 7 x weekly - 1 sets - 3 reps - 30-60 sec hold - Standing Quadratus Lumborum Stretch with Doorway  - 2 x daily - 7 x weekly - 1 sets - 2 reps - 60 sec hold - Sidelying Thoracic Rotation with Open Book  - 1 x daily - 7 x weekly - 2 sets - 10 reps - 5 second hold hold - Supine Lower Trunk Rotation  - 1 x daily - 7 x weekly - 10 reps - 5 seconds  hold - Child's Pose Stretch  - 1 x daily - 7 x weekly - 2 sets - 30 seconds hold - Child's Pose with Sidebending  - 1 x daily - 7 x weekly - 2 sets - 30 seconds hold - Supine Active Straight Leg Raise  - 1 x daily - 7 x weekly - 2 sets - 10 reps - 5 sec hold - Supine Posterior Pelvic Tilt  - 1 x daily - 7 x weekly - 2 sets - 10 reps - 5 sec hold - Supine Bridge with Mini Swiss Ball Between Knees  - 1 x daily - 7 x weekly - 2 sets - 10 reps - 5 sec hold - Supine Sciatic Nerve Glide  - 2 x daily - 7 x weekly - 1 sets - 10 reps - 5 sec hold - Seated Sciatic Tensioner  - 2 x daily - 7 x weekly - 1 sets - 10 reps - 5 second hold - Bird Dog  - 1 x daily - 7 x weekly - 1-3 sets -  10 reps - 5 sec hold - Supine 90/90 leg extensions  - 1 x daily - 7 x weekly - 1-3 sets - 10 reps - 3 sed hold - Hip Abduction with Resistance Loop  - 1 x daily - 3-4 x weekly - 2 sets - 10 reps - 3 sec hold - Hip Extension with Resistance Loop  - 1 x daily - 3-4 x weekly - 2 sets - 10 reps - 3 sec hold - Marching with Resistance  - 1 x daily - 3-4 x weekly - 2 sets - 10 reps - 3 sec hold - Hooklying Sequential Leg March and Lower  - 1 x daily - 3-4 x weekly - 3 sets - 10 reps - Side Plank with Hip Drops  - 1 x daily - 7 x weekly - 1 sets - 10 reps  ASSESSMENT:  CLINICAL IMPRESSION:  ***   OBJECTIVE IMPAIRMENTS: decreased activity tolerance, decreased strength,  increased muscle spasms, impaired flexibility, postural dysfunction, and pain.   ACTIVITY LIMITATIONS: standing and locomotion level  PARTICIPATION LIMITATIONS:  when pain present  PERSONAL FACTORS: Time since onset of injury/illness/exacerbation and 1-2 comorbidities: anxiety, L4/5 fusion  are also affecting patient's functional outcome.   REHAB POTENTIAL: Excellent  CLINICAL DECISION MAKING: Stable/uncomplicated  EVALUATION COMPLEXITY: Low   GOALS: Goals reviewed with patient? Yes  SHORT TERM GOALS: Target date: 11/11/2022   Patient will be independent with initial HEP.  Baseline:  Goal status: MET- 11/16/22  LONG TERM GOALS: Target date: 11/25/2022, extended to 01/06/2023   Patient will be independent with advanced/ongoing HEP to improve outcomes and carryover.  Baseline:  Goal status: IN PROGRESS  12/21/22 - met for current HEP  2.  Patient will report 75% improvement in low back pain with standing ADLS to improve QOL.  Baseline:  Goal status: IN PROGRESS  12/21/22 - 20% improvement  3.   Patient will demonstrate improved functional core strength as demonstrated by ability to stabilize with seated and prone MMT of LE. Baseline:  Goal status: MET  4.  Patient will report 60 on lumbar FOTO to demonstrate improved functional ability.  Baseline: 56.3% Goal status: IN PROGRESS  11/25/22 - 56.3%  5.  Patient to demonstrate ability to achieve and maintain good spinal alignment/posturing and body mechanics needed for daily activities. Baseline: increased lordosis and ant pelvic tilt Goal status: PARTIALLY MET  12/09/22 - Pt reports good awareness of posture with most daily tasks  6.  Improved Mod Oswestry to </= 12% to demonstrate improved function Baseline: 12 / 50 = 24.0 % Goal status: IN PROGRESS  11/25/22 - 16%  PLAN:  PT FREQUENCY: 2x/week  PT DURATION: 4 weeks  PLANNED INTERVENTIONS: Therapeutic exercises, Therapeutic activity, Neuromuscular re-education,  Patient/Family education, Self Care, Joint mobilization, Dry Needling, Electrical stimulation, Spinal mobilization, Cryotherapy, Moist heat, Taping, and Manual therapy.  PLAN FOR NEXT SESSION:  continue KT tape and DN; focus on core strengthening and hip strengthening, avoid extension exercises; MT +/- DN as indicated   Caysie Minnifield, PT 01/05/2023, 10:20 AM

## 2023-01-06 ENCOUNTER — Encounter: Payer: Self-pay | Admitting: Physical Therapy

## 2023-01-06 ENCOUNTER — Ambulatory Visit: Payer: 59 | Admitting: Physical Therapy

## 2023-01-06 DIAGNOSIS — M5459 Other low back pain: Secondary | ICD-10-CM

## 2023-01-06 DIAGNOSIS — M62838 Other muscle spasm: Secondary | ICD-10-CM

## 2023-01-06 DIAGNOSIS — M6281 Muscle weakness (generalized): Secondary | ICD-10-CM

## 2023-01-06 NOTE — Patient Instructions (Signed)
dn

## 2024-10-28 ENCOUNTER — Encounter (HOSPITAL_BASED_OUTPATIENT_CLINIC_OR_DEPARTMENT_OTHER): Payer: Self-pay | Admitting: Emergency Medicine

## 2024-10-28 ENCOUNTER — Emergency Department (HOSPITAL_BASED_OUTPATIENT_CLINIC_OR_DEPARTMENT_OTHER)
Admission: EM | Admit: 2024-10-28 | Discharge: 2024-10-28 | Disposition: A | Discharge status: Home / Self Care | Attending: Emergency Medicine | Admitting: Emergency Medicine

## 2024-10-28 ENCOUNTER — Emergency Department (HOSPITAL_BASED_OUTPATIENT_CLINIC_OR_DEPARTMENT_OTHER)

## 2024-10-28 ENCOUNTER — Other Ambulatory Visit: Payer: Self-pay

## 2024-10-28 DIAGNOSIS — W000XXA Fall on same level due to ice and snow, initial encounter: Secondary | ICD-10-CM | POA: Diagnosis not present

## 2024-10-28 DIAGNOSIS — S4991XA Unspecified injury of right shoulder and upper arm, initial encounter: Secondary | ICD-10-CM | POA: Diagnosis present

## 2024-10-28 DIAGNOSIS — W19XXXA Unspecified fall, initial encounter: Secondary | ICD-10-CM

## 2024-10-28 MED ORDER — OXYCODONE HCL 5 MG PO TABS: 5.0000 mg | ORAL_TABLET | ORAL | 0 refills | Status: AC | PRN

## 2024-10-28 NOTE — ED Triage Notes (Signed)
 States slipped on ice x 2 hrs ago and landed on right shoulder. NO LOC but limited ROM, CMS intact

## 2024-10-28 NOTE — ED Provider Notes (Signed)
 " [facility] Provider Note   CSN: [id number] Arrival date & time: 10/28/24  1106     Patient presents with: Albert Skinner is a 44 y.o. male.   44 year old male presents today for concern of slip and fall on ice where he fell directly onto his right shoulder.  He is right-hand dominant.  Denies any head injury.  No other injuries from the fall.  He states he was just outside walking around when this occurred.  The history is provided by the patient. No language interpreter was used.       Prior to Admission medications  Medication Sig Start Date End Date Taking? Authorizing Provider  oxyCODONE  (ROXICODONE ) 5 MG immediate release tablet Take 1 tablet (5 mg total) by mouth every 4 (four) hours as needed for severe pain (pain score 7-10). 10/28/24  Yes Kaysen Deal, PA-C  ALPRAZolam  (XANAX ) 0.25 MG tablet Take 0.25 mg by mouth 2 (two) times daily as needed for anxiety. Patient not taking: Reported on 10/28/2022    [provider]  amLODipine  (NORVASC ) 10 MG tablet Take 10 mg by mouth daily. Patient not taking: Reported on 10/28/2022    [provider]  calcium  carbonate (TUMS - DOSED IN MG ELEMENTAL CALCIUM ) 500 MG chewable tablet Chew 2 tablets by mouth 3 (three) times daily as needed for indigestion or heartburn. Patient not taking: Reported on 10/28/2022    [provider]  cyclobenzaprine  (FLEXERIL ) 10 MG tablet Take 1 tablet (10 mg total) by mouth 3 (three) times daily as needed for muscle spasms. Patient not taking: Reported on 10/28/2022 06/11/17   Meyran, Suzen Lacks, NP  hydrochlorothiazide  (HYDRODIURIL ) 25 MG tablet Take 25 mg by mouth daily.    [provider]  sertraline  (ZOLOFT ) 100 MG tablet Take 100 mg by mouth at bedtime.    [provider]  traZODone (DESYREL) 100 MG tablet Take 200 mg by mouth at bedtime as needed for sleep.    [provider]  zolpidem  (AMBIEN ) 10  MG tablet Take 10 mg by mouth at bedtime as needed for sleep. Patient not taking: Reported on 10/28/2022    [provider]    Allergies: Patient has no known allergies.    Review of Systems  Constitutional:  Negative for chills and fever.  Musculoskeletal:  Positive for arthralgias.  All other systems reviewed and are negative.   Updated Vital Signs BP 137/88 (BP Location: Left Arm)   Pulse (!) 111   Temp 98 F (36.7 C) (Oral)   Resp 17   Ht 5' 11 (1.803 m)   Wt 101.2 kg   SpO2 100%   BMI 31.10 kg/m   Physical Exam Vitals and nursing note reviewed.  Constitutional:      General: He is not in acute distress.    Appearance: Normal appearance. He is not ill-appearing.  HENT:     Head: Normocephalic and atraumatic.     Nose: Nose normal.  Eyes:     Conjunctiva/sclera: Conjunctivae normal.  Cardiovascular:     Rate and Rhythm: Normal rate and regular rhythm.  Pulmonary:     Effort: Pulmonary effort is normal. No respiratory distress.  Musculoskeletal:        General: No deformity.     Comments: Mild tenderness over the right shoulder.  Range of motion reduced at the right shoulder.  Good range of motion of the right elbow.  No tenderness over the cervical  spine.  No obvious deformity.  Skin:    Findings: No rash.  Neurological:     Mental Status: He is alert.     (all labs ordered are listed, but only abnormal results are displayed) Labs Reviewed - No data to display  EKG: None  Radiology: DG Shoulder Right Result Date: 10/28/2024 CLINICAL DATA:  S/P fall, right shoulder pain EXAM: RIGHT SHOULDER - 2+ VIEW COMPARISON:  None Available. FINDINGS: No acute fracture or dislocation. Mild joint space narrowing and osteophyte formation of the acromioclavicular joint. No area of erosion or osseous destruction. No unexpected radiopaque foreign body. Soft tissues are unremarkable. IMPRESSION: No acute fracture or dislocation. Electronically Signed   By: Corean Salter M.D.   On: 10/28/2024 12:07     Procedures   Medications Ordered in the ED - No data to display                                  Medical Decision Making Amount and/or Complexity of Data Reviewed Radiology: ordered.  Risk Prescription drug management.   44 year old male presents today for concern of fall on the right arm.  No head injury.  No obvious deformity.  Neurovascularly intact. X-ray obtained which shows no acute bony abnormality.  Likely soft tissue injury.  Ortho referral given.  Short course of pain medicine given.  Shoulder immobilized.  Discharged in stable condition.  Final diagnoses:  Fall, initial encounter  Injury of right shoulder, initial encounter    ED Discharge Orders          Ordered    oxyCODONE  (ROXICODONE ) 5 MG immediate release tablet  Every 4 hours PRN        10/28/24 1243               Hildegard Loge, PA-C 10/28/24 1404    Lenor Hollering, MD 10/28/24 1414  "
